# Patient Record
Sex: Male | Born: 1970
Health system: Southern US, Community
[De-identification: ages and names within clinical notes are randomized; demographics above are authoritative.]

## PROBLEM LIST (undated history)

## (undated) DIAGNOSIS — B019 Varicella without complication: Secondary | ICD-10-CM

## (undated) HISTORY — PX: HAND SURGERY: SHX662

## (undated) HISTORY — PX: CLAVICLE SURGERY: SHX598

## (undated) HISTORY — DX: Varicella without complication: B01.9

---

## 2008-05-18 ENCOUNTER — Emergency Department: Payer: Self-pay | Admitting: Emergency Medicine

## 2008-05-21 ENCOUNTER — Ambulatory Visit: Payer: Self-pay | Admitting: Orthopedic Surgery

## 2015-01-17 ENCOUNTER — Emergency Department
Admission: EM | Admit: 2015-01-17 | Discharge: 2015-01-17 | Disposition: A | Discharge status: Home / Self Care | Payer: BLUE CROSS/BLUE SHIELD | Attending: Emergency Medicine | Admitting: Emergency Medicine

## 2015-01-17 ENCOUNTER — Encounter: Payer: Self-pay | Admitting: Emergency Medicine

## 2015-01-17 DIAGNOSIS — K1379 Other lesions of oral mucosa: Secondary | ICD-10-CM | POA: Diagnosis not present

## 2015-01-17 DIAGNOSIS — R220 Localized swelling, mass and lump, head: Secondary | ICD-10-CM | POA: Diagnosis present

## 2015-01-17 DIAGNOSIS — K121 Other forms of stomatitis: Secondary | ICD-10-CM

## 2015-01-17 MED ORDER — DEXAMETHASONE SODIUM PHOSPHATE 10 MG/ML IJ SOLN
INTRAMUSCULAR | Status: AC
  Administered 2015-01-17: 10 mg via INTRAMUSCULAR
  Filled 2015-01-17: qty 1

## 2015-01-17 MED ORDER — DEXAMETHASONE SODIUM PHOSPHATE 10 MG/ML IJ SOLN
10.0000 mg | Freq: Once | INTRAMUSCULAR | Status: AC
  Administered 2015-01-17: 10 mg via INTRAMUSCULAR

## 2015-01-17 MED ORDER — AMOXICILLIN 500 MG PO CAPS
500.0000 mg | ORAL_CAPSULE | Freq: Once | ORAL | Status: AC
  Administered 2015-01-17: 500 mg via ORAL

## 2015-01-17 MED ORDER — AMOXICILLIN 500 MG PO CAPS
ORAL_CAPSULE | ORAL | Status: AC
  Administered 2015-01-17: 500 mg via ORAL
  Filled 2015-01-17: qty 1

## 2015-01-17 MED ORDER — KETOROLAC TROMETHAMINE 10 MG PO TABS
ORAL_TABLET | ORAL | Status: DC
Start: 2015-01-17 — End: 2015-01-18
  Filled 2015-01-17: qty 1

## 2015-01-17 MED ORDER — KETOROLAC TROMETHAMINE 10 MG PO TABS: 10.0000 mg | ORAL_TABLET | Freq: Once | ORAL | Status: DC

## 2015-01-17 MED ORDER — AMOXICILLIN 500 MG PO TABS
500.0000 mg | ORAL_TABLET | Freq: Two times a day (BID) | ORAL | Status: DC
Start: 2015-01-17 — End: 2015-05-09

## 2015-01-17 MED ORDER — KETOROLAC TROMETHAMINE 10 MG PO TABS: 10.0000 mg | ORAL_TABLET | Freq: Four times a day (QID) | ORAL | Status: DC | PRN

## 2015-01-17 NOTE — ED Provider Notes (Signed)
[facility] Emergency Department Provider Note  ____________________________________________  Time seen: 2026  I have reviewed the triage vital signs and the nursing notes.   HISTORY  Chief Complaint Oral Swelling    HPI Warren Harrison is a 44 y.o. male arrives here today with swelling along his right jaw going down to his neck had dental surgery yesterday states that it started swelling up a lot and felt like he was feeling full in his neck and below his jaw and he was concerned denies any fevers chills or drainage states it has gone down somewhat ice denies any pain currently at the worst was a 2 out of 10 improved with ice but nothing seemingly making it otherwise worse or better is here today for further evaluation and possible treatment   History reviewed. No pertinent past medical history.  There are no active problems to display for this patient.   History reviewed. No pertinent past surgical history.  Current Outpatient Rx  Name  Route  Sig  Dispense  Refill  . amoxicillin (AMOXIL) 500 MG tablet   Oral   Take 1 tablet (500 mg total) by mouth 2 (two) times daily.   14 tablet   0   . ketorolac (TORADOL) 10 MG tablet   Oral   Take 1 tablet (10 mg total) by mouth every 6 (six) hours as needed.   20 tablet   0     Allergies Review of patient's allergies indicates no known allergies.  No family history on file.  Social History History  Substance Use Topics  . Smoking status: Never Smoker   . Smokeless tobacco: Not on file  . Alcohol Use: Yes    Review of Systems Constitutional: No fever/chills Eyes: No visual changes. ENT: No sore throat. Cardiovascular: Denies chest pain. Respiratory: Denies shortness of breath. Gastrointestinal: No abdominal pain.  No nausea, no vomiting.  No diarrhea.  No constipation. Genitourinary: Negative for dysuria. Musculoskeletal: Negative for back pain. Skin: Negative for rash. Neurological:  Negative for headaches, focal weakness or numbness.  10-point ROS otherwise negative.  ____________________________________________   PHYSICAL EXAM:  VITAL SIGNS: ED Triage Vitals  Enc Vitals Group     BP 01/17/15 1844 120/81 mmHg     Pulse Rate 01/17/15 1844 63     Resp 01/17/15 1844 18     Temp 01/17/15 1844 98.5 F (36.9 C)     Temp Source 01/17/15 1844 Oral     SpO2 01/17/15 1844 96 %     Weight 01/17/15 1844 200 lb (90.719 kg)     Height 01/17/15 1844 5\' 7"  (1.702 m)     Head Cir --      Peak Flow --      Pain Score 01/17/15 1844 2     Pain Loc --      Pain Edu? --      Excl. in GC? --     Constitutional: Alert and oriented. Well appearing and in no acute distress. Eyes: Conjunctivae are normal. PERRL. EOMI. Head: Atraumatic. Nose: No congestion/rhinnorhea. Mouth/Throat: Mucous membranes are moist.  Oropharynx non-erythematous. Mild swelling along the right mandible mild swelling in the submandibular and right anterior cervical lymph nodes Neck: No stridor.   Cardiovascular: Normal rate, regular rhythm. Grossly normal heart sounds.  Good peripheral circulation. Respiratory: Normal respiratory effort.  No retractions. Lungs CTAB. Gastrointestinal: Soft and nontender. No distention. No abdominal bruits. No CVA tenderness. Musculoskeletal: No lower extremity tenderness nor edema.  No joint  effusions. Neurologic:  Normal speech and language. No gross focal neurologic deficits are appreciated. Speech is normal. No gait instability. Skin:  Skin is warm, dry and intact. No rash noted. Psychiatric: Mood and affect are normal. Speech and behavior are normal.  ____________________________________________    PROCEDURES  Procedure(s) performed: None  Critical Care performed: No  ____________________________________________   INITIAL IMPRESSION / ASSESSMENT AND PLAN / ED COURSE  Pertinent labs & imaging results that were available during my care of the patient were  reviewed by me and considered in my medical decision making (see chart for details).  Initial impression on this patient has postsurgical inflammation patient had dental work less than 24 hours ago denies having swelling around that area moving towards his neck the swelling has gone down with ice while in the department but still feels inflamed to him given dexamethasone Toradol and start him on amoxicillin and recommended he follow with his dentist as soon as possible returning for any acute concerns or worsening symptoms as airway is open and intact normal voice no difficulty with food or fluids ____________________________________________   FINAL CLINICAL IMPRESSION(S) / ED DIAGNOSES  Final diagnoses:  Oral inflammation     Hertha Gergen Rosalyn Gess, PA-C 01/17/15 2111  Darien Ramus, MD 01/17/15 [phone]

## 2015-01-17 NOTE — ED Notes (Signed)
Pt with recent dental work and now having oral swelling and pain.

## 2015-01-17 NOTE — ED Notes (Signed)
Pt reports oral swelling x 24 hours w/ pain in lower right jaw area.  Pt NAD at this time

## 2015-05-08 ENCOUNTER — Ambulatory Visit: Payer: BLUE CROSS/BLUE SHIELD | Admitting: Family Medicine

## 2015-05-09 ENCOUNTER — Encounter: Payer: Self-pay | Admitting: Family Medicine

## 2015-05-09 ENCOUNTER — Ambulatory Visit (INDEPENDENT_AMBULATORY_CARE_PROVIDER_SITE_OTHER): Payer: BLUE CROSS/BLUE SHIELD | Admitting: Family Medicine

## 2015-05-09 ENCOUNTER — Other Ambulatory Visit: Payer: Self-pay | Admitting: Family Medicine

## 2015-05-09 VITALS — BP 108/72 | HR 68 | Temp 98.4°F | Ht 68.31 in | Wt 198.4 lb

## 2015-05-09 DIAGNOSIS — M545 Low back pain, unspecified: Secondary | ICD-10-CM | POA: Insufficient documentation

## 2015-05-09 DIAGNOSIS — E663 Overweight: Secondary | ICD-10-CM

## 2015-05-09 DIAGNOSIS — M79604 Pain in right leg: Secondary | ICD-10-CM

## 2015-05-09 DIAGNOSIS — M5441 Lumbago with sciatica, right side: Secondary | ICD-10-CM

## 2015-05-09 NOTE — Progress Notes (Signed)
Patient ID: Warren Harrison, male   DOB: [birth year], 44 y.o.   MRN: [id number]  Marikay Alar, MD Phone: [phone]  Warren Harrison is a 44 y.o. male who presents today for new patient visit.  Back pain: low back. Has been intermittently occuring for the past 3-4 years. Thought it was hamstring issue, though discovered it was in his low back. Mostly on the right side. Has a recurrence yearly. Often happens when he bends over the wrong way or over does it. Occasionally notes his toes (lateral toes) will feel like they are asleep with this. R>L. Has occasional shooting pain down the back of his right leg. No saddle anesthesia, fevers, cancer, incontinence, or weakness.Has tried home exercises for this with no benefit. Ibuprofen is helpful. Chiropractor has been beneficial. Last hurt back several weeks ago and is much improved at this time.   Active Ambulatory Problems    Diagnosis Date Noted  . Low back pain radiating to right leg 05/09/2015   Resolved Ambulatory Problems    Diagnosis Date Noted  . No Resolved Ambulatory Problems   Past Medical History  Diagnosis Date  . Chicken pox     Family History  Problem Relation Age of Onset  . Alcoholism      parent  . Arthritis      parent and grandparent  . Cancer      ovarian/uterine - sister  . Breast cancer      mother  . Heart disease      parent  . Stroke      grandparent  . Other      intestinal issues - parent with rupture of intestines, paternal grandparents with impaction followed by intestinal rupture, cousin with diverticulitis and intestinal rupture - all died from these issues    Social History   Social History  . Marital Status: Married    Spouse Name: N/A  . Number of Children: N/A  . Years of Education: N/A   Occupational History  . Not on file.   Social History Main Topics  . Smoking status: Never Smoker   . Smokeless tobacco: Not on file  . Alcohol Use: 4.2 - 8.4 oz/week    7-14 Standard drinks or  equivalent per week  . Drug Use: No  . Sexual Activity: Not on file   Other Topics Concern  . Not on file   Social History Narrative    ROS   General:  Negative for unexplained weight loss, fever Skin: Negative for new or changing mole, sore that won't heal HEENT: Negative for trouble hearing, trouble seeing, ringing in ears, mouth sores, hoarseness, change in voice, dysphagia. CV:  Negative for chest pain, dyspnea, edema, palpitations Resp: Negative for cough, dyspnea, hemoptysis GI: Negative for nausea, vomiting, diarrhea, constipation, abdominal pain, melena, hematochezia. GU: Negative for dysuria, incontinence, urinary hesitance, hematuria, vaginal or penile discharge, polyuria, sexual difficulty, lumps in testicle or breasts MSK: positive for back pain, Negative for muscle cramps or aches, joint pain or swelling Neuro: positive for numbness in lateral toes, Negative for headaches, weakness, dizziness, passing out/fainting Psych: Negative for depression, anxiety, memory problems  Objective  Physical Exam Filed Vitals:   05/09/15 0846  BP: 108/72  Pulse: 68  Temp: 98.4 F (36.9 C)    Physical Exam  Constitutional: He is well-developed, well-nourished, and in no distress.  HENT:  Head: Normocephalic and atraumatic.  Right Ear: External ear normal.  Left Ear: External ear normal.  Mouth/Throat: Oropharynx is clear and moist.  No oropharyngeal exudate.  Eyes: Conjunctivae are normal. Pupils are equal, round, and reactive to light.  Neck: Neck supple.  Cardiovascular: Normal rate, regular rhythm and normal heart sounds.  Exam reveals no gallop and no friction rub.   No murmur heard. 2+ DP pulses  Pulmonary/Chest: Effort normal and breath sounds normal. No respiratory distress. He has no wheezes. He has no rales.  Abdominal: Soft. Bowel sounds are normal. He exhibits no distension. There is no tenderness. There is no rebound and no guarding.  Musculoskeletal:  No midline  spine tenderness, no back tenderness, no swelling, negative straight leg raise  Lymphadenopathy:    He has no cervical adenopathy.  Neurological: He is alert.  5/5 strength in bilateral quads, hamstrings, plantar and dorsiflexion, sensation to light touch minimally diminished in right little toe, otherwise intact in bilateral LE, normal gait, 2+ patellar reflexes  Skin: Skin is warm and dry. He is not diaphoretic.  Psychiatric: Mood and affect normal.     Assessment/Plan:   Low back pain radiating to right leg Patient with several year history of low back pain with associated sciatica and intermittent lateral toe numbness R>L. No tenderness on exam. Minimal sensory deficit in right little toe. Negative straight leg raise. Given history it sounds as if the patient has a disc issue. Could be repeated strains, though with repeated LE symptoms is likely a disc issue. No red flags. Normal strength. Discussed options for work up and treatment. Discussed XR vs MRI imaging given LE symptoms and patient declined these. Discussed PT vs home exercises and patient opted for PT referral. Discussed gabapentin vs NSAIDs for this and patient declined medications at this time. Will refer to PT. Given return precautions.     Orders Placed This Encounter  Procedures  . PT eval and treat    Standing Status: Future     Number of Occurrences:      Standing Expiration Date: 05/08/2016    Marikay Alar

## 2015-05-09 NOTE — Patient Instructions (Signed)
Nice to meet you. We will refer you to PT for evaluation and treatment of your back pain.  You can continue to take ibuprofen as needed for this.  If you develop numbness, weakness, fever, incontinence, persistent pain, numbness between your legs, or change in symptoms please seek medical attention.   Back Pain, Adult Back pain is very common. The pain often gets better over time. The cause of back pain is usually not dangerous. Most people can learn to manage their back pain on their own.  HOME CARE  Watch your back pain for any changes. The following actions may help to lessen any pain you are feeling:  Stay active. Start with short walks on flat ground if you can. Try to walk farther each day.  Exercise regularly as told by your doctor. Exercise helps your back heal faster. It also helps avoid future injury by keeping your muscles strong and flexible.  Do not sit, drive, or stand in one place for more than 30 minutes.  Do not stay in bed. Resting more than 1-2 days can slow down your recovery.  Be careful when you bend or lift an object. Use good form when lifting:  Bend at your knees.  Keep the object close to your body.  Do not twist.  Sleep on a firm mattress. Lie on your side, and bend your knees. If you lie on your back, put a pillow under your knees.  Take medicines only as told by your doctor.  Put ice on the injured area.  Put ice in a plastic bag.  Place a towel between your skin and the bag.  Leave the ice on for 20 minutes, 2-3 times a day for the first 2-3 days. After that, you can switch between ice and heat packs.  Avoid feeling anxious or stressed. Find good ways to deal with stress, such as exercise.  Maintain a healthy weight. Extra weight puts stress on your back. GET HELP IF:   You have pain that does not go away with rest or medicine.  You have worsening pain that goes down into your legs or buttocks.  You have pain that does not get better in one  week.  You have pain at night.  You lose weight.  You have a fever or chills. GET HELP RIGHT AWAY IF:   You cannot control when you poop (bowel movement) or pee (urinate).  Your arms or legs feel weak.  Your arms or legs lose feeling (numbness).  You feel sick to your stomach (nauseous) or throw up (vomit).  You have belly (abdominal) pain.  You feel like you may pass out (faint).   This information is not intended to replace advice given to you by your health care provider. Make sure you discuss any questions you have with your health care provider.   Document Released: 01/05/2008 Document Revised: 08/09/2014 Document Reviewed: 11/20/2013 Elsevier Interactive Patient Education [employer]

## 2015-05-09 NOTE — Assessment & Plan Note (Signed)
Patient with several year history of low back pain with associated sciatica and intermittent lateral toe numbness R>L. No tenderness on exam. Minimal sensory deficit in right little toe. Negative straight leg raise. Given history it sounds as if the patient has a disc issue. Could be repeated strains, though with repeated LE symptoms is likely a disc issue. No red flags. Normal strength. Discussed options for work up and treatment. Discussed XR vs MRI imaging given LE symptoms and patient declined these. Discussed PT vs home exercises and patient opted for PT referral. Discussed gabapentin vs NSAIDs for this and patient declined medications at this time. Will refer to PT. Given return precautions.

## 2015-05-09 NOTE — Progress Notes (Signed)
Pre visit review using our clinic review tool, if applicable. No additional management support is needed unless otherwise documented below in the visit note. 

## 2018-01-18 ENCOUNTER — Ambulatory Visit: Payer: Self-pay | Admitting: Family Medicine

## 2018-01-18 DIAGNOSIS — E079 Disorder of thyroid, unspecified: Secondary | ICD-10-CM | POA: Diagnosis not present

## 2018-01-18 DIAGNOSIS — M7989 Other specified soft tissue disorders: Secondary | ICD-10-CM | POA: Diagnosis not present

## 2018-01-18 DIAGNOSIS — M67839 Other specified disorders of synovium and tendon, unspecified forearm: Secondary | ICD-10-CM | POA: Diagnosis not present

## 2018-01-18 NOTE — Telephone Encounter (Signed)
  Called in c/o having pains starting in both of his elbows and radiating to both of his hands and fingertips.   Having some tingling in fingertips too.   It's a constant aching pain.   See triage notes.  I was not able to get him in [facility] so I have referred him to the urgent care center since he needs to be seen within 24 hours per the protocol.    He was agreeable to this.   Reason for Disposition . Numbness (i.e., loss of sensation) in hand or fingers  Answer Assessment - Initial Assessment Questions 1. ONSET: "When did the pain start?"     I'm having shooting pains in both arms from the elbows to my fingertips.   Everything I touch feel warm like touching a hot cup of coffee.   It started about 48 hours ago. 2. LOCATION: "Where is the pain located?"     Aching pain from elbows to fingertips in both arms.    It's all the time.  Ibuprofen helps.   It started after I was doing yard work both Saturday and Sunday it started Sunday evening. 3. PAIN: "How bad is the pain?" (Scale 1-10; or mild, moderate, severe)   - MILD (1-3): doesn't interfere with normal activities   - MODERATE (4-7): interferes with normal activities (e.g., work or school) or awakens from sleep   - SEVERE (8-10): excruciating pain, unable to do any normal activities, unable to hold a cup of water     2 maybe on the pain scale. 4. WORK OR EXERCISE: "Has there been any recent work or exercise that involved this part of the body?"     Yard work Friday, Saturday, and Sunday.     I'm able to use my arms and hands normally. 5. CAUSE: "What do you think is causing the arm pain?"     I think it's a repetitive injury.    I'm not sure.   6. OTHER SYMPTOMS: "Do you have any other symptoms?" (e.g., neck pain, swelling, rash, fever, numbness, weakness)     No neck pain or stiffness.    Both of my wrists look swollen.   Very minor, I can still see the tendons and all ok. 7. PREGNANCY: "Is there  any chance you are pregnant?" "When was your last menstrual period?"     N/A  Protocols used: ARM PAIN-A-AH

## 2018-03-14 ENCOUNTER — Ambulatory Visit (INDEPENDENT_AMBULATORY_CARE_PROVIDER_SITE_OTHER): Payer: BLUE CROSS/BLUE SHIELD | Admitting: Family Medicine

## 2018-03-14 ENCOUNTER — Encounter: Payer: Self-pay | Admitting: Family Medicine

## 2018-03-14 VITALS — BP 104/60 | HR 67 | Temp 98.2°F | Resp 16 | Wt 190.4 lb

## 2018-03-14 DIAGNOSIS — H1033 Unspecified acute conjunctivitis, bilateral: Secondary | ICD-10-CM

## 2018-03-14 MED ORDER — OFLOXACIN 0.3 % OP SOLN: 1.0000 [drp] | Freq: Four times a day (QID) | OPHTHALMIC | 0 refills | Status: AC

## 2018-03-14 NOTE — Patient Instructions (Signed)
Great to meet you!  Eye drops. Avoid rubbing eye. Do good handwashing and sanitize commonly used surfaces. Change pillow cases to avoid spread/re-infection.

## 2018-03-14 NOTE — Progress Notes (Signed)
   Subjective:    Patient ID: Warren Harrison, male    DOB: August 07, 1970, 47 y.o.   MRN: [id number]  HPI   Presents to clinic due to suspected pink eye.  Patient states both his wife and young son is currently on treatment for pinkeye.  He woke up this morning with bilateral eye redness and bilateral eye itching.   Patient Active Problem List   Diagnosis Date Noted  . Low back pain radiating to right leg 05/09/2015     Social History   Tobacco Use  . Smoking status: Never Smoker  Substance Use Topics  . Alcohol use: Yes    Alcohol/week: 7.0 - 14.0 standard drinks    Types: 7 - 14 Standard drinks or equivalent per week   Review of Systems  Constitutional: Negative for chills, fatigue and fever.  HENT: Negative for congestion, ear pain, sinus pain and sore throat.   Eyes: Redness, ithching bilateral eyes Respiratory: Negative for cough, shortness of breath and wheezing.   Cardiovascular: Negative for chest pain, palpitations and leg swelling.  Gastrointestinal: Negative for abdominal pain, diarrhea, nausea and vomiting.  Genitourinary: Negative for dysuria, frequency and urgency.  Musculoskeletal: Negative for arthralgias and myalgias.  Skin: Negative for color change, pallor and rash.  Neurological: Negative for syncope, light-headedness and headaches.  Psychiatric/Behavioral: The patient is not nervous/anxious.    Objective:   Physical Exam  Constitutional: He appears well-developed and well-nourished. No distress.  HENT:  Head: Normocephalic and atraumatic.  Eyes: Pupils are equal, round, and reactive to light. Conjunctivae red and inflamed. No discharge at this time. EOM are normal. No scleral icterus.  Cardiovascular: Normal rate, regular rhythm.   Pulmonary/Chest: Effort normal. No respiratory distress.   Neurological: He is alert and oriented to person, place, and time.  Gait normal  Skin: Skin is warm and dry. He is not diaphoretic. No pallor.  Psychiatric: He has  a normal mood and affect. His behavior is normal.  Nursing note and vitals reviewed.      Assessment & Plan:    Conjunctivitis -- We will treat due to close contacts being infected. Eye drops. Avoid rubbing eye. Do good handwashing and sanitize commonly used surfaces. Change pillow cases to avoid spread/re-infection.  Follow up if symptoms persist or worsen.

## 2018-03-27 ENCOUNTER — Ambulatory Visit (INDEPENDENT_AMBULATORY_CARE_PROVIDER_SITE_OTHER): Payer: BLUE CROSS/BLUE SHIELD | Admitting: Family Medicine

## 2018-03-27 ENCOUNTER — Encounter: Payer: Self-pay | Admitting: Family Medicine

## 2018-03-27 VITALS — BP 130/70 | HR 67 | Temp 98.5°F | Ht 68.31 in | Wt 192.0 lb

## 2018-03-27 DIAGNOSIS — E663 Overweight: Secondary | ICD-10-CM

## 2018-03-27 DIAGNOSIS — E039 Hypothyroidism, unspecified: Secondary | ICD-10-CM | POA: Diagnosis not present

## 2018-03-27 MED ORDER — LEVOTHYROXINE SODIUM 25 MCG PO CAPS: 1.0000 | ORAL_CAPSULE | Freq: Every day | ORAL | 2 refills | Status: DC

## 2018-03-27 NOTE — Patient Instructions (Signed)
Hypothyroidism Hypothyroidism is a disorder of the thyroid. The thyroid is a large gland that is located in the lower front of the neck. The thyroid releases hormones that control how the body works. With hypothyroidism, the thyroid does not make enough of these hormones. What are the causes? Causes of hypothyroidism may include:  Viral infections.  Pregnancy.  Your own defense system (immune system) attacking your thyroid.  Certain medicines.  Birth defects.  Past radiation treatments to your head or neck.  Past treatment with radioactive iodine.  Past surgical removal of part or all of your thyroid.  Problems with the gland that is located in the center of your brain (pituitary).  What are the signs or symptoms? Signs and symptoms of hypothyroidism may include:  Feeling as though you have no energy (lethargy).  Inability to tolerate cold.  Weight gain that is not explained by a change in diet or exercise habits.  Dry skin.  Coarse hair.  Menstrual irregularity.  Slowing of thought processes.  Constipation.  Sadness or depression.  How is this diagnosed? Your health care provider may diagnose hypothyroidism with blood tests and ultrasound tests. How is this treated? Hypothyroidism is treated with medicine that replaces the hormones that your body does not make. After you begin treatment, it may take several weeks for symptoms to go away. Follow these instructions at home:  Take medicines only as directed by your health care provider.  If you start taking any new medicines, tell your health care provider.  Keep all follow-up visits as directed by your health care provider. This is important. As your condition improves, your dosage needs may change. You will need to have blood tests regularly so that your health care provider can watch your condition. Contact a health care provider if:  Your symptoms do not get better with treatment.  You are taking thyroid  replacement medicine and: ? You sweat excessively. ? You have tremors. ? You feel anxious. ? You lose weight rapidly. ? You cannot tolerate heat. ? You have emotional swings. ? You have diarrhea. ? You feel weak. Get help right away if:  You develop chest pain.  You develop an irregular heartbeat.  You develop a rapid heartbeat. This information is not intended to replace advice given to you by your health care provider. Make sure you discuss any questions you have with your health care provider. Document Released: 07/19/2005 Document Revised: 12/25/2015 Document Reviewed: 12/04/2013 Elsevier Interactive Patient Education  2018 [employer]  

## 2018-03-27 NOTE — Progress Notes (Signed)
   Subjective:    Patient ID: Warren Harrison, male    DOB: 22-Jul-1971, 47 y.o.   MRN: [id number]  HPI   Patient presents to clinic for follow-up on lab results received from [facility].  He went to urgent care due to bilateral wrist pain, some fatigue and the provider suspected possible hypothyroidism so labs were drawn. The thyroid panel was drawn showing a TSH of 5.190 indicating hypothyroidism.  His CBC and metabolic panel also reviewed from the urgent care and these were normal.  Patient also states he has gained at least 20 pounds in the past 4-5 months.  Filed Weights   03/27/18 1624  Weight: 192 lb (87.1 kg)     Patient Active Problem List   Diagnosis Date Noted  . Low back pain radiating to right leg 05/09/2015   Social History   Tobacco Use  . Smoking status: Never Smoker  Substance Use Topics  . Alcohol use: Yes    Alcohol/week: 7.0 - 14.0 standard drinks    Types: 7 - 14 Standard drinks or equivalent per week    Review of Systems  Constitutional: Negative for chills, fever. Some fatigue. Weight gain over the last 4-5 months of approx 20 pounds. HENT: Negative for congestion, ear pain, sinus pain and sore throat.   Eyes: Negative.   Respiratory: Negative for cough, shortness of breath and wheezing.   Cardiovascular: Negative for chest pain, palpitations and leg swelling.  Gastrointestinal: Negative for abdominal pain, diarrhea, nausea and vomiting.  Genitourinary: Negative for dysuria, frequency and urgency.  Musculoskeletal: Negative for arthralgias and myalgias.  Skin: Negative for color change, pallor and rash.  Neurological: Negative for syncope, light-headedness and headaches.  Psychiatric/Behavioral: The patient is not nervous/anxious.       Objective:   Physical Exam  Constitutional: He is oriented to person, place, and time. He appears well-developed and well-nourished. No distress.  HENT:  Head: Normocephalic and atraumatic.  Eyes:  Conjunctivae and EOM are normal. No scleral icterus.  Neck: Normal range of motion. Neck supple. No thyromegaly present.  Cardiovascular: Normal rate and regular rhythm.  Pulmonary/Chest: Effort normal and breath sounds normal. No respiratory distress.  Musculoskeletal:  Gait normal.  Neurological: He is alert and oriented to person, place, and time. No cranial nerve deficit.  Skin: Skin is warm and dry. No pallor.  Psychiatric: He has a normal mood and affect. His behavior is normal.  Nursing note and vitals reviewed.   Vitals:   03/27/18 1624  BP: 130/70  Pulse: 67  Temp: 98.5 F (36.9 C)  SpO2: 97%   Body mass index is 28.93 kg/m.     Assessment & Plan:    Hypothyroidism -- patient will begin low-dose levothyroxine 25 mcg daily.  He is aware to take this medication on empty stomach.  We will follow-up in approximately 6 weeks, lab work ordered for future visit to reassess thyroid function at that time.  Weight gain - patient was advised that hypothyroidism could play a part in the weight gain, but it is also important to be sure to follow good diet and regular exercise to keep weight under good control.   Follow up in 6 weeks for recheck.

## 2018-05-09 ENCOUNTER — Ambulatory Visit (INDEPENDENT_AMBULATORY_CARE_PROVIDER_SITE_OTHER): Payer: BLUE CROSS/BLUE SHIELD | Admitting: Family Medicine

## 2018-05-09 ENCOUNTER — Encounter: Payer: Self-pay | Admitting: Family Medicine

## 2018-05-09 VITALS — BP 102/60 | HR 66 | Temp 97.8°F | Ht 67.5 in | Wt 186.6 lb

## 2018-05-09 DIAGNOSIS — G4700 Insomnia, unspecified: Secondary | ICD-10-CM | POA: Diagnosis not present

## 2018-05-09 DIAGNOSIS — Z23 Encounter for immunization: Secondary | ICD-10-CM

## 2018-05-09 DIAGNOSIS — E039 Hypothyroidism, unspecified: Secondary | ICD-10-CM

## 2018-05-09 DIAGNOSIS — E038 Other specified hypothyroidism: Secondary | ICD-10-CM | POA: Insufficient documentation

## 2018-05-09 LAB — THYROID PANEL WITH TSH
Free Thyroxine Index: 1.8 (ref 1.4–3.8)
T3 Uptake: 39 % — ABNORMAL HIGH (ref 22–35)
T4, Total: 4.7 ug/dL — ABNORMAL LOW (ref 4.9–10.5)
TSH: 3.91 mIU/L (ref 0.40–4.50)

## 2018-05-09 NOTE — Progress Notes (Signed)
   Subjective:    Patient ID: Warren Harrison, male    DOB: [birth year], 47 y.o.   MRN: [id number]  HPI   Presents to clinic for 6 week follow up on thyroid after starting on levothyroxine 25 mcg.  He had a TSH level drawn at an urgent care which showed him to be hypothyroid, TSH was 5.190. We will recheck TSH levels today.  Patient has been tolerating medication well.  He has not missed any doses except 1 or 2.  States since taking medication he notices he has been sleeping slightly better, states he goes through periods where he has nights he cannot get to sleep, but has noticed with the levothyroxine they have not been as severe.   Patient Active Problem List   Diagnosis Date Noted  . Hypothyroidism 05/09/2018  . Low back pain radiating to right leg 05/09/2015   Social History   Tobacco Use  . Smoking status: Never Smoker  Substance Use Topics  . Alcohol use: Yes    Alcohol/week: 7.0 - 14.0 standard drinks    Types: 7 - 14 Standard drinks or equivalent per week   Review of Systems   Constitutional: Negative for chills, fatigue and fever.  HENT: Negative for congestion, ear pain, sinus pain and sore throat.   Eyes: Negative.   Respiratory: Negative for cough, shortness of breath and wheezing.   Cardiovascular: Negative for chest pain, palpitations and leg swelling.  Gastrointestinal: Negative for abdominal pain, diarrhea, nausea and vomiting.  Genitourinary: Negative for dysuria, frequency and urgency.  Musculoskeletal: Negative for arthralgias and myalgias.  Skin: Negative for color change, pallor and rash.  Neurological: Negative for syncope, light-headedness and headaches.  Psychiatric/Behavioral: The patient is not nervous/anxious.       Objective:   Physical Exam  Constitutional: He appears well-developed and well-nourished. No distress.  Head: Normocephalic and atraumatic.  Eyes: Conjunctivae and EOM are normal. No scleral icterus.  Neck: Normal range of motion.  Neck supple. No tracheal deviation present. No Thyromegaly. Cardiovascular: Normal rate, regular rhythm and normal heart sounds.  Pulmonary/Chest: Effort normal and breath sounds normal. No respiratory distress. He has no wheezes. He has no rales.  Neurological: He is alert and oriented to person, place, and time.  Gait normal  Skin: Skin is warm and dry. He is not diaphoretic. No pallor.  Psychiatric: He has a normal mood and affect. His behavior is normal. Thought content normal.   Nursing note and vitals reviewed.  Vitals:   05/09/18 1645  BP: 102/60  Pulse: 66  Temp: 97.8 F (36.6 C)  SpO2: 98%      Assessment & Plan:   Hypothyroidism - patient will continue on levothyroxine 25 mcg dose at this time.  New thyroid panel drawn in clinic today.  Depending on results of lab levels we will make medication adjustments accordingly.  Insomnia (off/on) -- Seems improved with levothyroxine.   Flu vaccine given  Follow-up in 6 months for recheck.  Return to clinic sooner if issues arise.

## 2018-05-10 ENCOUNTER — Encounter: Payer: Self-pay | Admitting: Family Medicine

## 2018-05-10 DIAGNOSIS — G4700 Insomnia, unspecified: Secondary | ICD-10-CM | POA: Insufficient documentation

## 2018-05-10 MED ORDER — LEVOTHYROXINE SODIUM 50 MCG PO TABS
50.0000 ug | ORAL_TABLET | Freq: Every day | ORAL | 1 refills | Status: DC
Start: 2018-05-10 — End: 2018-05-12

## 2018-05-10 NOTE — Addendum Note (Signed)
Addended by: Leanora Cover on: 05/10/2018 08:02 PM   Modules accepted: Orders

## 2018-05-11 ENCOUNTER — Telehealth: Payer: Self-pay | Admitting: Lab

## 2018-05-11 DIAGNOSIS — E039 Hypothyroidism, unspecified: Secondary | ICD-10-CM

## 2018-05-11 NOTE — Telephone Encounter (Signed)
Called Pt to see what is the correct Pharmacy and address that he wants his prescription of Levothyroxine sent to. But no one answered so I left a message. Pec can speak to the Pt.

## 2018-05-12 ENCOUNTER — Other Ambulatory Visit: Payer: Self-pay | Admitting: Lab

## 2018-05-12 DIAGNOSIS — E039 Hypothyroidism, unspecified: Secondary | ICD-10-CM

## 2018-05-12 MED ORDER — LEVOTHYROXINE SODIUM 50 MCG PO TABS: 50.0000 ug | ORAL_TABLET | Freq: Every day | ORAL | 1 refills | Status: DC

## 2018-07-14 ENCOUNTER — Ambulatory Visit (INDEPENDENT_AMBULATORY_CARE_PROVIDER_SITE_OTHER): Payer: BLUE CROSS/BLUE SHIELD | Admitting: Family Medicine

## 2018-07-14 ENCOUNTER — Encounter: Payer: Self-pay | Admitting: Family Medicine

## 2018-07-14 VITALS — BP 104/68 | HR 80 | Temp 98.7°F | Ht 67.0 in | Wt 195.0 lb

## 2018-07-14 DIAGNOSIS — J988 Other specified respiratory disorders: Secondary | ICD-10-CM | POA: Diagnosis not present

## 2018-07-14 DIAGNOSIS — R0989 Other specified symptoms and signs involving the circulatory and respiratory systems: Secondary | ICD-10-CM

## 2018-07-14 DIAGNOSIS — R059 Cough, unspecified: Secondary | ICD-10-CM

## 2018-07-14 DIAGNOSIS — R05 Cough: Secondary | ICD-10-CM | POA: Diagnosis not present

## 2018-07-14 MED ORDER — AZITHROMYCIN 250 MG PO TABS: ORAL_TABLET | ORAL | 0 refills | Status: DC

## 2018-07-14 MED ORDER — METHYLPREDNISOLONE 4 MG PO TBPK: ORAL_TABLET | ORAL | 0 refills | Status: DC

## 2018-07-14 NOTE — Progress Notes (Signed)
Subjective:    Patient ID: Warren Harrison, male    DOB: [birth year], 47 y.o.   MRN: [id number]  HPI   Patient complains of cough, chest congestion, feeling rundown for about 3 weeks.  Patient states his young children have both been sick for the past month or so as well.  States the cough is worse at night, has been using NyQuil to help her get some sleep.  Does cough up some thicker white/tan phlegm, productive cough only began the beginning of this week.  Denies fever or chills.  Denies shortness of breath or wheezing.   Patient Active Problem List   Diagnosis Date Noted  . Insomnia 05/10/2018  . Hypothyroidism 05/09/2018  . Low back pain radiating to right leg 05/09/2015   Social History   Tobacco Use  . Smoking status: Never Smoker  . Smokeless tobacco: Never Used  Substance Use Topics  . Alcohol use: Yes    Alcohol/week: 7.0 - 14.0 standard drinks    Types: 7 - 14 Standard drinks or equivalent per week   Review of Systems  Constitutional: +fatigue. Negative for chills, fever.  HENT: Negative for congestion, ear pain, sinus pain and sore throat.   Eyes: Negative.   Respiratory: +cough & chest congestion. Negative for shortness of breath and wheezing.   Cardiovascular: Negative for chest pain, palpitations and leg swelling.  Gastrointestinal: Negative for abdominal pain, diarrhea, nausea and vomiting.  Genitourinary: Negative for dysuria, frequency and urgency.  Musculoskeletal: Negative for arthralgias and myalgias.  Skin: Negative for color change, pallor and rash.  Neurological: Negative for syncope, light-headedness and headaches.  Psychiatric/Behavioral: The patient is not nervous/anxious.       Objective:   Physical Exam Vitals signs and nursing note reviewed.  Constitutional:      General: He is not in acute distress. HENT:     Head: Normocephalic and atraumatic.     Mouth/Throat:     Mouth: Mucous membranes are moist.  Eyes:     General: No scleral  icterus.    Extraocular Movements: Extraocular movements intact.     Conjunctiva/sclera: Conjunctivae normal.  Neck:     Musculoskeletal: Normal range of motion and neck supple. No neck rigidity.  Cardiovascular:     Rate and Rhythm: Normal rate and regular rhythm.  Pulmonary:     Effort: Pulmonary effort is normal. No respiratory distress.     Breath sounds: Wheezing (faint expiratory wheeze) present. No rhonchi or rales.     Comments: +wet sounding cough Skin:    General: Skin is warm and dry.     Coloration: Skin is not pale.  Neurological:     Mental Status: He is alert and oriented to person, place, and time.     Gait: Gait normal.  Psychiatric:        Mood and Affect: Mood normal.        Behavior: Behavior normal.    Vitals:   07/14/18 0916  BP: 104/68  Pulse: 80  Temp: 98.7 F (37.1 C)  SpO2: 95%      Assessment & Plan:   Respiratory infection, cough, chest congestion - due to length of time of symptoms and discolored phlegm with cough we will treat with Z-Pak.  Patient also take prednisone burst to help open up chest congestion.  Patient states that NyQuil has been fairly effective to calm cough at night and allow him to get some sleep, so he declines offer for other people cough medicine.  Patient  advised he can use an over-the-counter Mucinex during the daytime hours to help calm cough as well.  Advised to rest, increase fluids and do good handwashing.  Patient will keep regularly scheduled follow-up with PCP as planned advised return to clinic sooner if any issues arise.

## 2018-08-18 ENCOUNTER — Ambulatory Visit (INDEPENDENT_AMBULATORY_CARE_PROVIDER_SITE_OTHER): Payer: BLUE CROSS/BLUE SHIELD | Admitting: Family Medicine

## 2018-08-18 ENCOUNTER — Encounter: Payer: Self-pay | Admitting: Family Medicine

## 2018-08-18 ENCOUNTER — Ambulatory Visit (INDEPENDENT_AMBULATORY_CARE_PROVIDER_SITE_OTHER): Payer: BLUE CROSS/BLUE SHIELD

## 2018-08-18 VITALS — BP 98/60 | HR 78 | Temp 98.0°F | Resp 16 | Ht 67.0 in | Wt 194.4 lb

## 2018-08-18 DIAGNOSIS — Z20818 Contact with and (suspected) exposure to other bacterial communicable diseases: Secondary | ICD-10-CM

## 2018-08-18 DIAGNOSIS — R05 Cough: Secondary | ICD-10-CM

## 2018-08-18 DIAGNOSIS — R059 Cough, unspecified: Secondary | ICD-10-CM

## 2018-08-18 DIAGNOSIS — R058 Other specified cough: Secondary | ICD-10-CM

## 2018-08-18 DIAGNOSIS — R0989 Other specified symptoms and signs involving the circulatory and respiratory systems: Secondary | ICD-10-CM

## 2018-08-18 MED ORDER — ALBUTEROL SULFATE HFA 108 (90 BASE) MCG/ACT IN AERS: INHALATION_SPRAY | RESPIRATORY_TRACT | 0 refills | Status: DC

## 2018-08-18 MED ORDER — AMOXICILLIN-POT CLAVULANATE 875-125 MG PO TABS
1.0000 | ORAL_TABLET | Freq: Two times a day (BID) | ORAL | 0 refills | Status: DC
Start: 2018-08-18 — End: 2018-11-13

## 2018-08-18 NOTE — Progress Notes (Signed)
Subjective:    Patient ID: Warren Harrison, male    DOB: Jun 05, 1971, 48 y.o.   MRN: [id number]  HPI   Patient presents to clinic complaining of persistent cough that is productive of yellow or brown phlegm, feeling of phlegm in throat and also exposure to strep throat.  Patient has been sick off and on for the past 4 to 6 weeks with some sort of upper respiratory/congestion/cough type illness.  Patient does have a young child who is 46 years old, states child has been sick often in the recent weeks due to attending daycare.  Patient states it is almost impossible to not be exposed to illness from his child, because child always wants to sit on his lap.  Patient has been using over-the-counter cough syrup, and NyQuil at night which has been helpful in helping him to get sleep.  Denies fever or chills.  Denies nausea vomiting or diarrhea.  Denies feeling short of breath.  Patient Active Problem List   Diagnosis Date Noted  . Insomnia 05/10/2018  . Hypothyroidism 05/09/2018  . Low back pain radiating to right leg 05/09/2015   Social History   Tobacco Use  . Smoking status: Never Smoker  . Smokeless tobacco: Never Used  Substance Use Topics  . Alcohol use: Yes    Alcohol/week: 7.0 - 14.0 standard drinks    Types: 7 - 14 Standard drinks or equivalent per week    Review of Systems  Constitutional: Negative for chills, fatigue and fever.  HENT: Negative for congestion, ear pain, sinus pain +feeling of phlegm in throat. Eyes: Negative.   Respiratory: +cough with phlegm, chest congestion.   Cardiovascular: Negative for chest pain, palpitations and leg swelling.  Gastrointestinal: Negative for abdominal pain, diarrhea, nausea and vomiting.  Genitourinary: Negative for dysuria, frequency and urgency.  Musculoskeletal: Negative for arthralgias and myalgias.  Skin: Negative for color change, pallor and rash.  Neurological: Negative for syncope, light-headedness and headaches.    Psychiatric/Behavioral: The patient is not nervous/anxious.    Objective:   Physical Exam  Constitutional: He appears well-developed and well-nourished. No distress.  HENT:  Head: Normocephalic and atraumatic.  Eyes: Conjunctivae and EOM are normal. No scleral icterus.  Ears: Normal Nose/throat: Positive postnasal drip Neck: Normal range of motion. Neck supple. No tracheal deviation present.  Cardiovascular: Normal rate, regular rhythm and normal heart sounds.  Pulmonary/Chest: Effort normal and breath sounds normal. No respiratory distress. He has no wheezes. He has no rales.  Wet sounding cough.  Upper respiratory congestion. Neurological: He is alert and oriented to person, place, and time.  Gait normal  Skin: Skin is warm and dry. He is not diaphoretic. No pallor.  Psychiatric: He has a normal mood and affect. His behavior is normal. Thought content normal.  Nursing note and vitals reviewed.    Vitals:   08/18/18 0922  BP: 98/60  Pulse: 78  Resp: 16  Temp: 98 F (36.7 C)  SpO2: 97%       Assessment & Plan:   Cough productive purulent sputum, phlegm in throat, strep throat exposure - due to persistence of cough we will get chest x-ray in clinic.  Also due to production of purulent sputum as well as strep throat exposure we will cover patient with Augmentin that would treat any sort of strep throat as well as a pneumonia.  Patient will start taking a daily allergy medicine such as Claritin Allegra or Zyrtec to see if this helps dry up the phlegm in  throat/postnasal drip.  Patient also will use albuterol inhaler, advised to take 2 puffs twice per day for the next week, then use only if needed.  I am hoping that use of the inhaler will help the cough and congestion symptoms improve.  Patient will let us know if he is not improving over the next 2 weeks.  Otherwise advised to keep regularly scheduled follow-up with PCP as planned.

## 2018-08-18 NOTE — Patient Instructions (Signed)
Also take a daily claritin, allegra or zyrtec to see if this helps symptoms

## 2018-11-08 ENCOUNTER — Telehealth: Payer: Self-pay

## 2018-11-08 NOTE — Telephone Encounter (Signed)
Copied from CRM [id number]. Topic: General - Other >> Nov 08, 2018  3:27 PM Lorrine Kin, NT wrote: Reason for CRM: Patient returning call to [facility] regarding his appointment on 11/13/2018. Patient states that he is interested in a virtual visit with Dr Birdie Sons. Please advise. Email confirmed. [email or web address]

## 2018-11-10 ENCOUNTER — Ambulatory Visit: Payer: BLUE CROSS/BLUE SHIELD | Admitting: Family Medicine

## 2018-11-13 ENCOUNTER — Encounter: Payer: Self-pay | Admitting: Family Medicine

## 2018-11-13 ENCOUNTER — Telehealth: Payer: Self-pay | Admitting: Family Medicine

## 2018-11-13 ENCOUNTER — Other Ambulatory Visit: Payer: Self-pay

## 2018-11-13 ENCOUNTER — Ambulatory Visit (INDEPENDENT_AMBULATORY_CARE_PROVIDER_SITE_OTHER): Payer: BLUE CROSS/BLUE SHIELD | Admitting: Family Medicine

## 2018-11-13 DIAGNOSIS — J989 Respiratory disorder, unspecified: Secondary | ICD-10-CM

## 2018-11-13 DIAGNOSIS — E669 Obesity, unspecified: Secondary | ICD-10-CM | POA: Insufficient documentation

## 2018-11-13 DIAGNOSIS — E039 Hypothyroidism, unspecified: Secondary | ICD-10-CM

## 2018-11-13 NOTE — Assessment & Plan Note (Signed)
He will continue on Synthroid.  He will come in for lab work in 1 month.  Orders placed.

## 2018-11-13 NOTE — Telephone Encounter (Signed)
Please contact the patient and get him set up for lab work in 1 month.  Orders placed.  He will need 73-month follow-up in the office as well.

## 2018-11-13 NOTE — Progress Notes (Signed)
Virtual Visit via Video Note  This visit type was conducted due to national recommendations for restrictions regarding the COVID-19 pandemic (e.g. social distancing).  This format is felt to be most appropriate for this patient at this time.  All issues noted in this document were discussed and addressed.  No physical exam was performed (except for noted visual exam findings with Video Visits).   I connected with Warren Harrison on 11/13/18 at  3:30 PM EDT by a video enabled telemedicine application or telephone and verified that I am speaking with the correct person using two identifiers. Location patient: home Location provider: work Persons participating in the virtual visit: patient, provider  I discussed the limitations, risks, security and privacy concerns of performing an evaluation and management service by telephone and the availability of in person appointments. I also discussed with the patient that there may be a patient responsible charge related to this service. The patient expressed understanding and agreed to proceed.   Reason for visit: follow-up   HPI: HYPOTHYROIDISM Disease Monitoring Weight changes: trending down since starting on synthroid and with exercise changes  Skin Changes: no Heat/Cold intolerance: no  Medication Monitoring Compliance:  Taking synthroid   Last TSH:   Lab Results  Component Value Date   TSH 3.91 05/09/2018   Respiratory illness: Patient notes he got sick with cough and congestion last November and this did not resolve completely until March.  He notes it improved once the weather became warmer.  He had treatment with a Z-Pak at first with no benefit and then received a steroid, Augmentin, and albuterol.  He notes that helped quite a bit more though he did continue to have nighttime coughing.  He has not had issues since the beginning of March.  Obesity: Patient notes his weight went up about 15 pounds prior to going on the Synthroid.  He does  note he had some issues getting exercise when he had his respiratory illness.  Since his respiratory illness has improved he has started walking 2 miles a day Monday through Friday and he goes for bike rides up to 5 hours on the weekends.  No soda or sweet tea.  Diet is generally healthy though he does lance cookies, potato chips, and peanut butter pretzels.  ROS: See pertinent positives and negatives per HPI.  Past Medical History:  Diagnosis Date  . Chicken pox     Past Surgical History:  Procedure Laterality Date  . CLAVICLE SURGERY    . HAND SURGERY      Family History  Problem Relation Age of Onset  . Alcoholism Unknown        parent  . Arthritis Unknown        parent and grandparent  . Cancer Unknown        ovarian/uterine - sister  . Breast cancer Unknown        mother  . Heart disease Unknown        parent  . Stroke Unknown        grandparent  . Other Unknown        intestinal issues - parent with rupture of intestines, paternal grandparents with impaction followed by intestinal rupture, cousin with diverticulitis and intestinal rupture - all died from these issues    SOCIAL HX: Non-smoker.   Current Outpatient Medications:  .  albuterol (PROVENTIL HFA;VENTOLIN HFA) 108 (90 Base) MCG/ACT inhaler, Take 2 puffs 2 times per day and every 6 hours as needed for 1 week, then  use every 6 hours as needed, Disp: 1 Inhaler, Rfl: 0 .  levothyroxine (SYNTHROID, LEVOTHROID) 50 MCG tablet, Take 1 tablet (50 mcg total) by mouth daily., Disp: 90 tablet, Rfl: 1  EXAM:  VITALS per patient if applicable: none.  GENERAL: alert, oriented, appears well and in no acute distress  HEENT: atraumatic, conjunttiva clear, no obvious abnormalities on inspection of external nose and ears  NECK: normal movements of the head and neck  LUNGS: on inspection no signs of respiratory distress, breathing rate appears normal, no obvious gross SOB, gasping or wheezing  CV: no obvious  cyanosis  MS: moves all visible extremities without noticeable abnormality  PSYCH/NEURO: pleasant and cooperative, no obvious depression or anxiety, speech and thought processing grossly intact  ASSESSMENT AND PLAN:  Discussed the following assessment and plan:  Hypothyroidism, unspecified type - Plan: TSH  Respiratory illness  Obesity (BMI 30.0-34.9)  Hypothyroidism He will continue on Synthroid.  He will come in for lab work in 1 month.  Orders placed.  Respiratory illness It sounds as though the patient had some component of reactive airway disease.  This could represent a seasonal asthma.  He is no longer having symptoms and thus he will continue to monitor.  If his symptoms recur he will contact us.  Obesity (BMI 30.0-34.9) Encouraged continued exercise.  Discussed decreasing his snack intake.   Counseling provided on social distancing precautions and sickness precautions with regards to COVID-19.   I discussed the assessment and treatment plan with the patient. The patient was provided an opportunity to ask questions and all were answered. The patient agreed with the plan and demonstrated an understanding of the instructions.   The patient was advised to call back or seek an in-person evaluation if the symptoms worsen or if the condition fails to improve as anticipated.   Marikay Alar, MD

## 2018-11-13 NOTE — Assessment & Plan Note (Signed)
Encouraged continued exercise.  Discussed decreasing his snack intake.

## 2018-11-13 NOTE — Assessment & Plan Note (Signed)
It sounds as though the patient had some component of reactive airway disease.  This could represent a seasonal asthma.  He is no longer having symptoms and thus he will continue to monitor.  If his symptoms recur he will contact us.

## 2018-11-14 NOTE — Telephone Encounter (Signed)
Called and spoke with pt. Pt is a [employer] employee and just new working there will need lab order sto go to [facility].   Did not schedule pt for lab work but I was unsure if he needs his labs drawn at [facility] or we just send the tubes to [facility].   Pt has been scheduled for 6 month follow up 05/16/2019 @ 8:30 AM

## 2018-11-14 NOTE — Addendum Note (Signed)
Addended by: Birdie Sons, Breeana Sawtelle G on: 11/14/2018 06:30 PM   Modules accepted: Orders

## 2018-11-14 NOTE — Addendum Note (Signed)
Addended by: Glori Luis on: 11/14/2018 06:32 PM   Modules accepted: Orders

## 2018-11-14 NOTE — Telephone Encounter (Signed)
He could have the labs drawn in our office and sent to [facility] or he could go to a [facility] draw station to have them done. I have placed the orders to be done in our office though they will need to be changed if he wants to go to [facility].

## 2018-11-15 NOTE — Telephone Encounter (Signed)
Order changed.

## 2018-11-15 NOTE — Addendum Note (Signed)
Addended by: Glori Luis on: 11/15/2018 04:21 PM   Modules accepted: Orders

## 2018-11-15 NOTE — Telephone Encounter (Signed)
Called and spoke with pt and pt would like to go to the [facility] patient service center   Address: [address], [place], [place] [address]  Phone: [phone]

## 2018-11-15 NOTE — Telephone Encounter (Signed)
Called pt and left a VM to call me back on my direct number to schedule for lab work.

## 2019-05-14 ENCOUNTER — Other Ambulatory Visit: Payer: Self-pay

## 2019-05-16 ENCOUNTER — Encounter: Payer: Self-pay | Admitting: Family Medicine

## 2019-05-16 ENCOUNTER — Ambulatory Visit (INDEPENDENT_AMBULATORY_CARE_PROVIDER_SITE_OTHER): Payer: Self-pay | Admitting: Family Medicine

## 2019-05-16 ENCOUNTER — Other Ambulatory Visit: Payer: Self-pay

## 2019-05-16 VITALS — BP 110/70 | HR 70 | Temp 97.4°F | Ht 67.0 in | Wt 197.8 lb

## 2019-05-16 DIAGNOSIS — E669 Obesity, unspecified: Secondary | ICD-10-CM

## 2019-05-16 DIAGNOSIS — F320 Major depressive disorder, single episode, mild: Secondary | ICD-10-CM

## 2019-05-16 DIAGNOSIS — Z23 Encounter for immunization: Secondary | ICD-10-CM

## 2019-05-16 DIAGNOSIS — E039 Hypothyroidism, unspecified: Secondary | ICD-10-CM

## 2019-05-16 NOTE — Progress Notes (Signed)
  Tommi Rumps, MD Phone: [phone]  Warren Harrison is a 48 y.o. male who presents today for f/u.  HYPOTHYROIDISM Disease Monitoring Weight changes: no  Skin Changes: no Heat/Cold intolerance: no    Palpitations: no  Medication Monitoring Compliance:  Not currently taking medication. Ran out 6 months ago and did not refill it.   Last TSH:   Lab Results  Component Value Date   TSH 3.91 05/09/2018   Obesity: Patient notes he is not been exercising quite as much.  He is walking 4 times a week.  His diet is awful.  He has trouble sticking to a good diet with out a normal routine.  Depression: Patient notes he has got a little bit of depression at this time.  This time of year is typically tough for him as his previous marriage fell apart about 10 years ago.  He notes in the past he has lost his self-awareness regarding the depression at a certain point though he has not gone there now.  He still functioning and going to work.  He has been on medication about 10 years ago though none since then.  He did therapy in 2017.  No SI.  Social History   Tobacco Use  Smoking Status Never Smoker  Smokeless Tobacco Never Used     ROS see history of present illness  Objective  Physical Exam Vitals:   05/16/19 0851  BP: 110/70  Pulse: 70  Temp: (!) 97.4 F (36.3 C)  SpO2: 97%    BP Readings from Last 3 Encounters:  05/16/19 110/70  08/18/18 98/60  07/14/18 104/68   Wt Readings from Last 3 Encounters:  05/16/19 197 lb 12.8 oz (89.7 kg)  08/18/18 194 lb 6.4 oz (88.2 kg)  07/14/18 195 lb (88.5 kg)    Physical Exam Constitutional:      General: He is not in acute distress.    Appearance: He is not diaphoretic.  Neck:     Thyroid: No thyroid mass, thyromegaly or thyroid tenderness.  Cardiovascular:     Rate and Rhythm: Normal rate and regular rhythm.     Heart sounds: Normal heart sounds.  Pulmonary:     Effort: Pulmonary effort is normal.     Breath sounds: Normal  breath sounds.  Musculoskeletal:     Right lower leg: No edema.     Left lower leg: No edema.  Skin:    General: Skin is warm and dry.  Neurological:     Mental Status: He is alert.      Assessment/Plan: Please see individual problem list.  Depression, major, single episode, mild ([place]) Patient with symptoms of mild depression.  Discussed medication or therapy though he deferred these at this time.  We will follow-up in about 3 months.  He will contact us if any worsening.  Advised to go to the emergency room for any suicidal ideation.  Hypothyroidism Check TSH.  Consider restarting medication based on labs.  Obesity (BMI 30.0-34.9) Encouraged continued activity.  Discussed dietary changes.  Check lab work.   Health Maintenance: Reports a Tdap in 2016.  Orders Placed This Encounter  Procedures  . TSH  . Comp Met (CMET)  . Lipid panel  . HgB A1c    No orders of the defined types were placed in this encounter.    Tommi Rumps, MD [place]

## 2019-05-16 NOTE — Assessment & Plan Note (Signed)
Check TSH.  Consider restarting medication based on labs.

## 2019-05-16 NOTE — Patient Instructions (Signed)
Nice to see you. We will check lab work today and contact you with the results. Please continue with exercise. Please monitor your depression and if it worsens please contact your therapist or [place].  We can consider medication if you would like.

## 2019-05-16 NOTE — Assessment & Plan Note (Signed)
Patient with symptoms of mild depression.  Discussed medication or therapy though he deferred these at this time.  We will follow-up in about 3 months.  He will contact us if any worsening.  Advised to go to the emergency room for any suicidal ideation.

## 2019-05-16 NOTE — Assessment & Plan Note (Signed)
Encouraged continued activity.  Discussed dietary changes.  Check lab work.

## 2019-05-17 ENCOUNTER — Other Ambulatory Visit: Payer: Self-pay | Admitting: Family Medicine

## 2019-05-17 DIAGNOSIS — R7989 Other specified abnormal findings of blood chemistry: Secondary | ICD-10-CM

## 2019-05-17 LAB — COMPREHENSIVE METABOLIC PANEL
ALT: 26 IU/L (ref 0–44)
AST: 24 IU/L (ref 0–40)
Albumin/Globulin Ratio: 2.1 (ref 1.2–2.2)
Albumin: 4.5 g/dL (ref 4.0–5.0)
Alkaline Phosphatase: 39 IU/L (ref 39–117)
BUN/Creatinine Ratio: 15 (ref 9–20)
BUN: 12 mg/dL (ref 6–24)
Bilirubin Total: 0.3 mg/dL (ref 0.0–1.2)
CO2: 22 mmol/L (ref 20–29)
Calcium: 9.4 mg/dL (ref 8.7–10.2)
Chloride: 101 mmol/L (ref 96–106)
Creatinine, Ser: 0.79 mg/dL (ref 0.76–1.27)
GFR calc Af Amer: 124 mL/min/{1.73_m2} (ref >59)
GFR calc non Af Amer: 108 mL/min/{1.73_m2} (ref >59)
Globulin, Total: 2.1 g/dL (ref 1.5–4.5)
Glucose: 94 mg/dL (ref 65–99)
Potassium: 4.3 mmol/L (ref 3.5–5.2)
Sodium: 140 mmol/L (ref 134–144)
Total Protein: 6.6 g/dL (ref 6.0–8.5)

## 2019-05-17 LAB — LIPID PANEL
Chol/HDL Ratio: 2.3 ratio (ref 0.0–5.0)
Cholesterol, Total: 165 mg/dL (ref 100–199)
HDL: 71 mg/dL (ref >39)
LDL Chol Calc (NIH): 82 mg/dL (ref 0–99)
Triglycerides: 60 mg/dL (ref 0–149)
VLDL Cholesterol Cal: 12 mg/dL (ref 5–40)

## 2019-05-17 LAB — HEMOGLOBIN A1C
Est. average glucose Bld gHb Est-mCnc: 117 mg/dL
Hgb A1c MFr Bld: 5.7 % — ABNORMAL HIGH (ref 4.8–5.6)

## 2019-05-17 LAB — TSH: TSH: 4.55 u[IU]/mL — ABNORMAL HIGH (ref 0.450–4.500)

## 2019-05-24 LAB — SPECIMEN STATUS REPORT

## 2019-05-25 LAB — T4, FREE: Free T4: 0.98 ng/dL (ref 0.82–1.77)

## 2019-05-25 LAB — SPECIMEN STATUS REPORT

## 2019-08-09 NOTE — Progress Notes (Signed)
I mailed the patient his lab results after not being able to reach by phone.  Aeric Burnham,cma

## 2019-08-17 ENCOUNTER — Encounter: Payer: Self-pay | Admitting: Family Medicine

## 2019-08-17 ENCOUNTER — Ambulatory Visit (INDEPENDENT_AMBULATORY_CARE_PROVIDER_SITE_OTHER): Payer: BC Managed Care – PPO | Admitting: Family Medicine

## 2019-08-17 ENCOUNTER — Other Ambulatory Visit: Payer: Self-pay

## 2019-08-17 DIAGNOSIS — F320 Major depressive disorder, single episode, mild: Secondary | ICD-10-CM

## 2019-08-17 DIAGNOSIS — E039 Hypothyroidism, unspecified: Secondary | ICD-10-CM

## 2019-08-17 DIAGNOSIS — E038 Other specified hypothyroidism: Secondary | ICD-10-CM

## 2019-08-17 DIAGNOSIS — Z1283 Encounter for screening for malignant neoplasm of skin: Secondary | ICD-10-CM

## 2019-08-17 NOTE — Progress Notes (Signed)
Virtual Visit via video Note  This visit type was conducted due to national recommendations for restrictions regarding the COVID-19 pandemic (e.g. social distancing).  This format is felt to be most appropriate for this patient at this time.  All issues noted in this document were discussed and addressed.  No physical exam was performed (except for noted visual exam findings with Video Visits).   I connected with Varney Daily today at  9:30 AM EST by a video enabled telemedicine application or telephone and verified that I am speaking with the correct person using two identifiers. Location patient: home Location provider: work Persons participating in the virtual visit: patient, provider  I discussed the limitations, risks, security and privacy concerns of performing an evaluation and management service by telephone and the availability of in person appointments. I also discussed with the patient that there may be a patient responsible charge related to this service. The patient expressed understanding and agreed to proceed.  Reason for visit: follow-up  HPI: Depression: Patient notes he had a bad week the week of the election in November.  Once everything calmed down related to that his mood improved.  He does note a low level of depression most of the time.  He notes no SI.  He is not interested in treatment currently unless he has recurrent dips in his mood.  Subclinical hypothyroidism: Patient's lab work revealed subclinical hypothyroidism on last visit.  He had been off of Synthroid for 6 months at that time.  He notes no fatigue, dry skin, cold intolerance, or constipation.  Skin check: The patient would like to be referred to a dermatologist for skin check.  His wife had a fairly large lesion removed and noted she would like him to be evaluated to ensure he has no lesions.  He notes no specific suspicious lesions though he does note sun exposure when he was younger.  ROS: See  pertinent positives and negatives per HPI.  Past Medical History:  Diagnosis Date  . Chicken pox     Past Surgical History:  Procedure Laterality Date  . CLAVICLE SURGERY    . HAND SURGERY      Family History  Problem Relation Age of Onset  . Alcoholism Unknown        parent  . Arthritis Unknown        parent and grandparent  . Cancer Unknown        ovarian/uterine - sister  . Breast cancer Unknown        mother  . Heart disease Unknown        parent  . Stroke Unknown        grandparent  . Other Unknown        intestinal issues - parent with rupture of intestines, paternal grandparents with impaction followed by intestinal rupture, cousin with diverticulitis and intestinal rupture - all died from these issues    SOCIAL HX: no wmoker   Current Outpatient Medications:  .  albuterol (PROVENTIL HFA;VENTOLIN HFA) 108 (90 Base) MCG/ACT inhaler, Take 2 puffs 2 times per day and every 6 hours as needed for 1 week, then use every 6 hours as needed, Disp: 1 Inhaler, Rfl: 0 .  levothyroxine (SYNTHROID, LEVOTHROID) 50 MCG tablet, Take 1 tablet (50 mcg total) by mouth daily., Disp: 90 tablet, Rfl: 1  EXAM:  VITALS per patient if applicable:  GENERAL: alert, oriented, appears well and in no acute distress  HEENT: atraumatic, conjunttiva clear, no obvious abnormalities on  inspection of external nose and ears  NECK: normal movements of the head and neck  LUNGS: on inspection no signs of respiratory distress, breathing rate appears normal, no obvious gross SOB, gasping or wheezing  CV: no obvious cyanosis  MS: moves all visible extremities without noticeable abnormality  PSYCH/NEURO: pleasant and cooperative, no obvious depression or anxiety, speech and thought processing grossly intact  ASSESSMENT AND PLAN:  Discussed the following assessment and plan:  Subclinical hypothyroidism Asymptomatic.  We will periodically recheck his labs.  No treatment needed at this  time.  Depression, major, single episode, mild (H[facility]) Mild symptoms.  He declines treatment at this time.  He will monitor and let us know if he would like to proceed with medication or therapy in the future.  Skin exam, screening for cancer Refer to dermatology.   Orders Placed This Encounter  Procedures  . Ambulatory referral to Dermatology    Referral Priority:   Routine    Referral Type:   Consultation    Referral Reason:   Specialty Services Required    Requested Specialty:   Dermatology    Number of Visits Requested:   1    No orders of the defined types were placed in this encounter.    I discussed the assessment and treatment plan with the patient. The patient was provided an opportunity to ask questions and all were answered. The patient agreed with the plan and demonstrated an understanding of the instructions.   The patient was advised to call back or seek an in-person evaluation if the symptoms worsen or if the condition fails to improve as anticipated.   Tommi Rumps, MD

## 2019-08-17 NOTE — Assessment & Plan Note (Signed)
Mild symptoms.  He declines treatment at this time.  He will monitor and let us know if he would like to proceed with medication or therapy in the future.

## 2019-08-17 NOTE — Assessment & Plan Note (Signed)
Refer to dermatology 

## 2019-08-17 NOTE — Assessment & Plan Note (Signed)
Asymptomatic.  We will periodically recheck his labs.  No treatment needed at this time.

## 2019-08-22 ENCOUNTER — Ambulatory Visit: Payer: Self-pay | Admitting: Family Medicine

## 2019-12-14 ENCOUNTER — Other Ambulatory Visit: Payer: Self-pay

## 2019-12-14 ENCOUNTER — Telehealth (INDEPENDENT_AMBULATORY_CARE_PROVIDER_SITE_OTHER): Payer: BC Managed Care – PPO | Admitting: Family Medicine

## 2019-12-14 DIAGNOSIS — F320 Major depressive disorder, single episode, mild: Secondary | ICD-10-CM | POA: Diagnosis not present

## 2019-12-14 DIAGNOSIS — Z3009 Encounter for other general counseling and advice on contraception: Secondary | ICD-10-CM | POA: Insufficient documentation

## 2019-12-14 DIAGNOSIS — E669 Obesity, unspecified: Secondary | ICD-10-CM

## 2019-12-14 MED ORDER — ESCITALOPRAM OXALATE 10 MG PO TABS: 10.0000 mg | ORAL_TABLET | Freq: Every day | ORAL | 3 refills | Status: DC

## 2019-12-14 NOTE — Assessment & Plan Note (Signed)
Continues to be an issue.  We will prescribe Lexapro for him.  Advised to seek medical attention if he developed SI.  Discussed risk of weight gain and sexual dysfunction with this medicine and if those happen to let us know.  Discussed that it may take 6 to 8 weeks for him to notice a big benefit.  Follow-up around that time.

## 2019-12-14 NOTE — Assessment & Plan Note (Signed)
Refer to urology.  ?

## 2019-12-14 NOTE — Assessment & Plan Note (Signed)
Encouraged adding an additional exercise and continue to work on his diet.

## 2019-12-14 NOTE — Progress Notes (Signed)
Virtual Visit via video Note  This visit type was conducted due to national recommendations for restrictions regarding the COVID-19 pandemic (e.g. social distancing).  This format is felt to be most appropriate for this patient at this time.  All issues noted in this document were discussed and addressed.  No physical exam was performed (except for noted visual exam findings with Video Visits).   I connected with Warren Harrison today at 10:30 AM EDT by a video enabled telemedicine application and verified that I am speaking with the correct person using two identifiers. Location patient: home Location provider: work Persons participating in the virtual visit: patient, provider  I discussed the limitations, risks, security and privacy concerns of performing an evaluation and management service by telephone and the availability of in person appointments. I also discussed with the patient that there may be a patient responsible charge related to this service. The patient expressed understanding and agreed to proceed.  Reason for visit: f/u.  HPI: Depression: Notes this has gotten worse over the past week or so.  He got to the point where his internal response was that this is not okay.  He denies SI.  Over the last day or so the depression has been less bad.  He has a lot going on with his family and work.  His father-in-law has been in and out of the hospital.  He got a promotion in January and just recently hired a second person to help him with the 3 jobs that he was doing so things may get better at work.  Obesity: Notes his weight has been stable.  His diet has been a little bit better with less junk food though he is eating out some.  Exercise has decreased relating to his schedule with everything that has been going on at home.  He has started to do more yard work which 2 to 3 hours a week.  Vasectomy evaluation: Patient would like to have a vasectomy.  He is done having kids.  He has 2  children.   ROS: See pertinent positives and negatives per HPI.  Past Medical History:  Diagnosis Date  . Chicken pox     Past Surgical History:  Procedure Laterality Date  . CLAVICLE SURGERY    . HAND SURGERY      Family History  Problem Relation Age of Onset  . Alcoholism Unknown        parent  . Arthritis Unknown        parent and grandparent  . Cancer Unknown        ovarian/uterine - sister  . Breast cancer Unknown        mother  . Heart disease Unknown        parent  . Stroke Unknown        grandparent  . Other Unknown        intestinal issues - parent with rupture of intestines, paternal grandparents with impaction followed by intestinal rupture, cousin with diverticulitis and intestinal rupture - all died from these issues    SOCIAL HX: Non-smoker   Current Outpatient Medications:  .  albuterol (PROVENTIL HFA;VENTOLIN HFA) 108 (90 Base) MCG/ACT inhaler, Take 2 puffs 2 times per day and every 6 hours as needed for 1 week, then use every 6 hours as needed, Disp: 1 Inhaler, Rfl: 0 .  levothyroxine (SYNTHROID, LEVOTHROID) 50 MCG tablet, Take 1 tablet (50 mcg total) by mouth daily., Disp: 90 tablet, Rfl: 1 .  escitalopram (  LEXAPRO) 10 MG tablet, Take 1 tablet (10 mg total) by mouth daily., Disp: 30 tablet, Rfl: 3  EXAM:  VITALS per patient if applicable:  GENERAL: alert, oriented, appears well and in no acute distress  HEENT: atraumatic, conjunttiva clear, no obvious abnormalities on inspection of external nose and ears  NECK: normal movements of the head and neck  LUNGS: on inspection no signs of respiratory distress, breathing rate appears normal, no obvious gross SOB, gasping or wheezing  CV: no obvious cyanosis  MS: moves all visible extremities without noticeable abnormality  PSYCH/NEURO: pleasant and cooperative, no obvious depression or anxiety, speech and thought processing grossly intact  ASSESSMENT AND PLAN:  Discussed the following assessment  and plan:  Depression, major, single episode, mild (HCC) Continues to be an issue.  We will prescribe Lexapro for him.  Advised to seek medical attention if he developed SI.  Discussed risk of weight gain and sexual dysfunction with this medicine and if those happen to let us know.  Discussed that it may take 6 to 8 weeks for him to notice a big benefit.  Follow-up around that time.  Obesity (BMI 30.0-34.9) Encouraged adding an additional exercise and continue to work on his diet.  Vasectomy evaluation Refer to urology.   Orders Placed This Encounter  Procedures  . Ambulatory referral to Urology    Referral Priority:   Routine    Referral Type:   Consultation    Referral Reason:   Specialty Services Required    Requested Specialty:   Urology    Number of Visits Requested:   1    Meds ordered this encounter  Medications  . escitalopram (LEXAPRO) 10 MG tablet    Sig: Take 1 tablet (10 mg total) by mouth daily.    Dispense:  30 tablet    Refill:  3     I discussed the assessment and treatment plan with the patient. The patient was provided an opportunity to ask questions and all were answered. The patient agreed with the plan and demonstrated an understanding of the instructions.   The patient was advised to call back or seek an in-person evaluation if the symptoms worsen or if the condition fails to improve as anticipated.    Marikay Alar, MD

## 2020-01-09 ENCOUNTER — Ambulatory Visit: Payer: BC Managed Care – PPO | Admitting: Urology

## 2020-01-09 ENCOUNTER — Encounter: Payer: Self-pay | Admitting: Urology

## 2020-01-09 ENCOUNTER — Other Ambulatory Visit: Payer: Self-pay

## 2020-01-09 VITALS — BP 109/72 | HR 66 | Ht 67.0 in | Wt 199.0 lb

## 2020-01-09 DIAGNOSIS — Z3009 Encounter for other general counseling and advice on contraception: Secondary | ICD-10-CM

## 2020-01-09 MED ORDER — DIAZEPAM 5 MG PO TABS: 5.0000 mg | ORAL_TABLET | Freq: Once | ORAL | 0 refills | Status: DC | PRN

## 2020-01-09 NOTE — Progress Notes (Signed)
01/09/20 12:48 PM   Varney Daily 25-Jun-1971 [id number]  CC: Discuss vasectomy  HPI: Mr. Geissinger is a healthy 49 year old male with 2 children ages 61 and 5, and he and his partner do not desire any further biologic children.  He works in Consulting civil engineer.   PMH: Past Medical History:  Diagnosis Date  . Chicken pox     Surgical History: Past Surgical History:  Procedure Laterality Date  . CLAVICLE SURGERY    . HAND SURGERY      Family History: Family History  Problem Relation Age of Onset  . Alcoholism Other        parent  . Arthritis Other        parent and grandparent  . Cancer Other        ovarian/uterine - sister  . Breast cancer Other        mother  . Heart disease Other        parent  . Stroke Other        grandparent  . Other Other        intestinal issues - parent with rupture of intestines, paternal grandparents with impaction followed by intestinal rupture, cousin with diverticulitis and intestinal rupture - all died from these issues    Social History:  reports that he has never smoked. He has never used smokeless tobacco. He reports current alcohol use of about 7.0 - 14.0 standard drinks of alcohol per week. He reports that he does not use drugs.  Physical Exam: BP 109/72   Pulse 66   Ht 5\' 7"  (1.702 m)   Wt 199 lb (90.3 kg)   BMI 31.17 kg/m    Constitutional:  Alert and oriented, No acute distress. Cardiovascular: No clubbing, cyanosis, or edema. Respiratory: Normal respiratory effort, no increased work of breathing. GI: Abdomen is soft, nontender, nondistended, no abdominal masses GU: Testicles 20 cc and descended bilaterally without masses, vas deferens easily palpable bilaterally  Assessment & Plan:   In summary, he is a healthy 49 year old male who desires vasectomy for permanent sterilization.  We discussed the risks and benefits of vasectomy at length.  Vasectomy is intended to be a permanent form of contraception, and does not produce  immediate sterility.  Following vasectomy another form of contraception is required until vas occlusion is confirmed by a post-vasectomy semen analysis obtained 2-3 months after the procedure.  Even after vas occlusion is confirmed, vasectomy is not 100% reliable in preventing pregnancy, and the failure rate is approximately 08/1998.  Repeat vasectomy is required in less than 1% of patients.  He should refrain from ejaculation for 1 week after vasectomy.  Options for fertility after vasectomy include vasectomy reversal, and sperm retrieval with in vitro fertilization or ICSI.  These options are not always successful and may be expensive.  Finally, there are other permanent and non-permanent alternatives to vasectomy available. There is no risk of erectile dysfunction, and the volume of semen will be similar to prior, as the majority of the ejaculate is from the prostate and seminal vesicles.   The procedure takes ~20 minutes.  We recommend patients take 5-10 mg of Valium 30 minutes prior, and he will need a driver post-procedure.  Local anesthetic is injected into the scrotal skin and a small segment of the vas deferens is removed, and the ends occluded. The complication rate is approximately 1-2%, and includes bleeding, infection, and development of chronic scrotal pain.  PLAN: Pending insurance approval, will schedule for vasectomy at his convenience  Nickolas Madrid, MD 01/09/2020  [facility] [address], [place], [place] [address] [phone]

## 2020-01-09 NOTE — Patient Instructions (Signed)
Pre-Vasectomy Instructions  STOP all aspirin or blood thinners (Aspirin, Plavix, Coumadin, Warfarin, Motrin, Ibuprofen, Advil, Aleve, Naproxen, Naprosyn) for 7 days prior to the procedure.  If you have any questions about stopping these medications please contact your primary care physician or cardiologist.  Shave all hair from the upper scrotum on the day of the procedure.  This means just under the penis onto the scrotal sac.  The area shaved should measure about 2-3 inches around.  You may lather the scrotum with soap and water, and shave with a safety razor.  After shaving the area, thoroughly wash the penis and the scrotum, then shower or bathe to remove all the loose hairs.  If needed, wash the area again just before coming in for your circumcision.  It is recommended to have a light meal an hour or so prior to the procedure.  Bring a scrotal support (jock strap or suspensory, or tight jockey shorts or underwear).  Wear comfortable pants or shorts.  While the actual procedure usually takes about 45 minutes, you should be prepared to stay in the office for approximately one hour.  Bring someone with you to drive you home.  If you have any questions or concerns, please feel free to call the office at [phone].    Vasectomy Vasectomy is a procedure in which the tube that carries sperm from the testicle to the urethra (vas deferens) is tied. It may also be cut. The procedure blocks sperm from going through the vas deferens and penis during ejaculation. This ensures that sperm does not go into the vagina during sex. Vasectomy does not affect your sexual desire or performance, and does not prevent sexually transmitted diseases. Vasectomy is considered a permanent and very effective form of birth control (contraception). The decision to have a vasectomy should not be made during a stressful situation, such as after the loss of a pregnancy or a divorce. You and your partner should make the  decision to have a vasectomy when you are sure that you do not want children in the future. Tell a health care provider about:  Any allergies you have.  All medicines you are taking, including vitamins, herbs, eye drops, creams, and over-the-counter medicines.  Any problems you or family members have had with anesthetic medicines.  Any blood disorders you have.  Any surgeries you have had.  Any medical conditions you have. What are the risks? Generally, this is a safe procedure. However, problems may occur, including:  Infection.  Bleeding and swelling of the scrotum.  Allergic reactions to medicines.  Failure of the procedure to prevent pregnancy. There is a very small chance that the cut ends of the vas deferens may reconnect (recanalization), meaning that you could still make a woman pregnant.  Pain in the scrotum that continues after healing from the procedure. What happens before the procedure?  Ask your health care provider about: ? Changing or stopping your regular medicines. This is especially important if you are taking diabetes medicines or blood thinners. ? Taking over-the-counter medicines, vitamins, herbs, and supplements. ? Taking medicines such as aspirin and ibuprofen. These medicines can thin your blood. Do not take these medicines unless your health care provider tells you to take them.  You may be asked to shower with a germ-killing soap.  Plan to have someone take you home from the hospital or clinic. What happens during the procedure?   To lower your risk of infection: ? Your health care team will wash or   sanitize their hands. ? Hair may be removed from the surgical area. ? Your scrotum will be washed with soap.  You will be given one or more of the following: ? A medicine to help you relax (sedative). You may be instructed to take this a few hours before the procedure. ? A medicine to numb the area (local anesthetic).  Your health care provider  will feel (palpate) for your vas deferens.  To reach the vas deferens, one of two methods may be used: ? A very small incision may be made in your scrotum. ? A punctured opening may be made in your scrotum, without an incision.  Your vas deferens will be pulled out of your scrotum, and may be: ? Tied off. ? Cut and possibly burned (cauterized) at the ends to seal them off.  The vas deferens will be put back into your scrotum.  The incision or puncture opening will be closed with absorbable stitches (sutures). The sutures will eventually dissolve and will not need to be removed after the procedure. The procedure may vary among health care providers and hospitals. What happens after the procedure?  You will be monitored to make sure that you do not experience problems.  You will be asked not to ejaculate for at least 1 week after the procedure, or as long as directed.  You will need to use a different form of contraception for 2-4 months after the procedure, until you have test results confirming that there are no sperm in your semen.  You may be given scrotal support to wear, such as a jock strap or underwear with a supportive pouch.  Do not drive for 24 hours if you were given a sedative to help you relax. Summary  Vasectomy is considered a permanent and very effective form of birth control (contraception). The procedure prevents sperm from being released during ejaculation.  Your scrotum will be numbed with medicine (local anesthetic) for the procedure.  After the procedure, you will be asked not to ejaculate for at least 1 week, or for as long as directed. You will also need to use a different form of contraception until your health care provider examines you and finds that there are no sperm in your semen. This information is not intended to replace advice given to you by your health care provider. Make sure you discuss any questions you have with your health care  provider. Document Revised: 01/20/2018 Document Reviewed: 10/15/2016 Elsevier Patient Education  2020 [employer]  

## 2020-02-07 ENCOUNTER — Ambulatory Visit: Payer: BC Managed Care – PPO | Admitting: Urology

## 2020-02-07 ENCOUNTER — Other Ambulatory Visit: Payer: Self-pay

## 2020-02-07 ENCOUNTER — Encounter: Payer: Self-pay | Admitting: Urology

## 2020-02-07 VITALS — BP 105/70 | HR 66 | Ht 67.0 in | Wt 199.0 lb

## 2020-02-07 DIAGNOSIS — Z302 Encounter for sterilization: Secondary | ICD-10-CM

## 2020-02-07 HISTORY — PX: VASECTOMY: SHX75

## 2020-02-07 NOTE — Patient Instructions (Signed)
Vasectomy, Care After °This sheet gives you information about how to care for yourself after your procedure. Your health care provider may also give you more specific instructions. If you have problems or questions, contact your health care provider. °What can I expect after the procedure? °After your procedure, it is common to have: °· Mild pain, swelling, redness, or discomfort in your scrotum. °· Some blood coming from your incisions or puncture sites for one or two days. °· Blood in your semen. °Follow these instructions at home: °Medicines ° °· Take over-the-counter and prescription medicines only as told by your health care provider. °· Avoid taking NSAIDs such as aspirin and ibuprofen, because these medicines can make bleeding worse. °Activity °· For the first 2 days after surgery, avoid physical activity and exercise that require a lot of energy. Ask your health care provider what activities are safe for you. °· Do not participate in sports or perform heavy physical labor until your pain has improved, or until your health care provider says it is okay. °· Do not ejaculate for at least 1 week after the procedure, or as long as directed. °· You may resume sexual activity 7-10 days after your procedure, or when your health care provider approves. Use a different method of birth control (contraception) until you have had test results that confirm that there is no sperm in your semen. °Scrotal support °· Use scrotal support, such as a jock strap or underwear with a supportive pouch, as needed for one week after your procedure. °· If you feel discomfort in your scrotum, you may remove the scrotal support to see if the discomfort is relieved. Sometimes scrotal support can press on the scrotum and cause or worsen discomfort. °· If your skin gets irritated, you may add some germ-free (sterile), fluffed bandages or a clean washcloth to the scrotal support. °General instructions °· Put ice on the injured area: °? Put  ice in a plastic bag. °? Place a towel between your skin and the bag. °? Leave the ice on for 20 minutes, 2-3 times a day. °· Check your incisions or puncture sites every day for signs of infection. Check for: °? Redness, swelling, or pain. °? Fluid or blood. °? Warmth. °? Pus or a bad smell. °· Leave stitches (sutures) in place. The sutures will dissolve on their own and do not need to be removed. °· Keep all follow-up visits as told by your health care provider. This is important because you will need a test to confirm that there is no sperm in your semen. Multiple ejaculations are needed to clear out sperm that were beyond the vasectomy site. You will need one test result showing that there is no sperm in your semen before you can resume unprotected sex. This may take 2-4 months after your procedure. °· Do not drive for 24 hours if you were given a sedative to help you relax. °Contact a health care provider if: °· You have redness, swelling, or more pain around your incision or puncture site, or in your scrotum area in general. °· You have bleeding from your incision or puncture site. °· You have pus or a bad smell coming from your incision or puncture site. °· You have a fever. °· Your incision or puncture site opens up. °Get help right away if: °· You develop a rash. °· You have difficulty breathing. °Summary °· After your procedure it is common to have mild pain, swelling, redness, or discomfort in your scrotum. °·   Avoid physical activity and exercise that requires a lot of energy for the first 2 days after surgery. °· Put ice on the injured area. Leave the ice on for 20 minutes, 2-3 times a day. °· Do not drive for 24 hours if you were given a sedative to help you relax. °This information is not intended to replace advice given to you by your health care provider. Make sure you discuss any questions you have with your health care provider. °Document Revised: 07/01/2017 Document Reviewed: 10/15/2016 °Elsevier  Patient Education © 2020 Else[employer] Inc. ° °

## 2020-02-07 NOTE — Progress Notes (Signed)
VASECTOMY PROCEDURE NOTE:  The patient was taken to the minor procedure room and placed in the supine position. His genitals were prepped and draped in the usual sterile fashion. The right vas deferens was brought up to the skin of the right upper scrotum. The skin overlying it was anesthetized with 1% lidocaine without epinephrine, anesthetic was also injected alongside the vas deferens in the direction of the inguinal canal. The no scalpel vasectomy instrument was used to make a small perforation in the scrotal skin. The vasectomy clamp was used to grasp the vas deferens. It was carefully dissected free from surrounding structures. A 1cm segment of the vas was removed, and the cut ends of the mucosa were cauterized. No significant bleeding was noted. The vas deferens was returned to the scrotum. The skin incision was closed with a simple interrupted stitch of 4-0 chromic.  Attention was then turned to the left side. The left vasectomy was performed in the same exact fashion. Sterile dressings were placed over each incision. The patient tolerated the procedure well.  IMPRESSION/DIAGNOSIS: The patient is a 49 year old gentleman who underwent a vasectomy today. Post-procedure instructions were reviewed. I stressed the importance of continuing to use birth control until he provides a semen specimen more than 2 months from now that demonstrates azoospermia.  We discussed return precautions including fever over 101, significant bleeding or hematoma, or uncontrolled pain. I also stressed the importance of avoiding strenuous activity for one week, no sexual activity or ejaculations for 5 days, intermittent icing over the next 48 hours, and scrotal support.   PLAN: The patient will be advised of his semen analysis results when available.   Legrand Rams, MD 02/07/2020

## 2020-05-09 ENCOUNTER — Other Ambulatory Visit: Payer: Self-pay

## 2020-05-09 ENCOUNTER — Encounter: Payer: Self-pay | Admitting: Urology

## 2020-06-04 ENCOUNTER — Ambulatory Visit: Payer: BC Managed Care – PPO

## 2020-06-04 ENCOUNTER — Telehealth: Payer: Self-pay | Admitting: Family Medicine

## 2020-06-04 ENCOUNTER — Ambulatory Visit (INDEPENDENT_AMBULATORY_CARE_PROVIDER_SITE_OTHER): Payer: BC Managed Care – PPO | Admitting: Nurse Practitioner

## 2020-06-04 ENCOUNTER — Other Ambulatory Visit: Payer: Self-pay

## 2020-06-04 ENCOUNTER — Encounter: Payer: Self-pay | Admitting: Nurse Practitioner

## 2020-06-04 VITALS — BP 110/80 | HR 67 | Temp 98.4°F | Ht 67.0 in | Wt 206.0 lb

## 2020-06-04 DIAGNOSIS — R079 Chest pain, unspecified: Secondary | ICD-10-CM

## 2020-06-04 DIAGNOSIS — Z23 Encounter for immunization: Secondary | ICD-10-CM | POA: Insufficient documentation

## 2020-06-04 NOTE — Addendum Note (Signed)
Addended by: Bonnell Public I on: 06/04/2020 12:51 PM   Modules accepted: Orders

## 2020-06-04 NOTE — Progress Notes (Addendum)
Established Patient Office Visit  Subjective:  Patient ID: Warren Harrison, male    DOB: 1970-09-29  Age: 49 y.o. MRN: [id number]  CC:  Chief Complaint  Patient presents with  . Acute Visit    acid reflux/heartburn    HPI Warren Harrison is a 49 year old male with medical history of subclinical hypothyroidism, obesity, depression, who comes in reporting 2 episodes of chest pain.    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   He reports that he had an episode in June while driving of 30 minutes of severe midsternal squeezing chest pain rated 9 out of 10.  This came on out of the blue without any association with eating or drinking.  This was a severe squeezing sensation that occurred from left to right, and radiated up into bilateral jaw and neck.  This was associated with difficulty taking a deep breath and he did feel short of breath.  This lasted 30 minutes.  He got out of the car when he got to the location, sat and rested it resolved.  He then went on to the camping site and proceeded to lift out equipment, set up tents, and had no further symptoms.  This does not have any association with exertion.  He has had no further events until this last Friday.  He was again driving in the car, and felt this tight squeezing severe again associated with shortness of breath that lasted about 20 minutes.  He had driven to 14 hours straight into [place].  He was able to get out of the car, and rested and it resolved.  About an hour and a half before this event he had eaten oatmeal.   RT lower leg size is noticeable larger than right. He reports baseline after cycling competitively for years. No change in years. No calf pain , cramps or tenderness. He did have a bad leg cramp in left leg. He gets leg cramps often.   He has had no problems with heartburn reflux or esophageal spasm.  He has no right upper quadrant  pain nausea or vomiting.  He has had no change in appetite diet or weight.  He does not typically have shortness of breath or DOE.  He can lay flat without problems.  His right leg is always larger than the left months but unchanged.  He has noted no calf pain or tenderness.  Denies any fevers or chills.  He has had no chest dizziness lightheadedness or weakness.  He had Covid vaccines in March and April.  He has not had the booster.    Past Medical History:  Diagnosis Date  . Chicken pox     Past Surgical History:  Procedure Laterality Date  . CLAVICLE SURGERY    . HAND SURGERY    . VASECTOMY  02/07/2020    Family History  Problem Relation Age of Onset  . Alcoholism Other        parent  . Arthritis Other        parent and grandparent  . Cancer Other        ovarian/uterine - sister  . Breast cancer Other        mother  . Heart disease Other        parent  . Stroke Other        grandparent  . Other Other  intestinal issues - parent with rupture of intestines, paternal grandparents with impaction followed by intestinal rupture, cousin with diverticulitis and intestinal rupture - all died from these issues    Social History   Socioeconomic History  . Marital status: Married    Spouse name: Not on file  . Number of children: Not on file  . Years of education: Not on file  . Highest education level: Not on file  Occupational History  . Not on file  Tobacco Use  . Smoking status: Never Smoker  . Smokeless tobacco: Never Used  Vaping Use  . Vaping Use: Never assessed  Substance and Sexual Activity  . Alcohol use: Yes    Alcohol/week: 7.0 - 14.0 standard drinks    Types: 7 - 14 Standard drinks or equivalent per week  . Drug use: No  . Sexual activity: Yes    Birth control/protection: Surgical    Comment: 02/07/2020-Vasectomy   Other Topics Concern  . Not on file  Social History Narrative  . Not on file   Social Determinants of Health   Financial Resource  Strain:   . Difficulty of Paying Living Expenses: Not on file  Food Insecurity:   . Worried About Charity fundraiser in the Last Year: Not on file  . Ran Out of Food in the Last Year: Not on file  Transportation Needs:   . Lack of Transportation (Medical): Not on file  . Lack of Transportation (Non-Medical): Not on file  Physical Activity:   . Days of Exercise per Week: Not on file  . Minutes of Exercise per Session: Not on file  Stress:   . Feeling of Stress : Not on file  Social Connections:   . Frequency of Communication with Friends and Family: Not on file  . Frequency of Social Gatherings with Friends and Family: Not on file  . Attends Religious Services: Not on file  . Active Member of Clubs or Organizations: Not on file  . Attends Archivist Meetings: Not on file  . Marital Status: Not on file  Intimate Partner Violence:   . Fear of Current or Ex-Partner: Not on file  . Emotionally Abused: Not on file  . Physically Abused: Not on file  . Sexually Abused: Not on file    Outpatient Medications Prior to Visit  Medication Sig Dispense Refill  . albuterol (PROVENTIL HFA;VENTOLIN HFA) 108 (90 Base) MCG/ACT inhaler Take 2 puffs 2 times per day and every 6 hours as needed for 1 week, then use every 6 hours as needed 1 Inhaler 0  . diazepam (VALIUM) 5 MG tablet Take 1 tablet (5 mg total) by mouth once as needed for up to 1 dose. 1 tablet 0   No facility-administered medications prior to visit.    Allergies  Allergen Reactions  . Neosporin [Neomycin-Bacitracin Zn-Polymyx] Hives    Review of Systems Pertinent positives noted in history of present illness and otherwise negative.    Objective:    Physical Exam Vitals reviewed.  Constitutional:      Appearance: Normal appearance.  HENT:     Head: Normocephalic and atraumatic.  Eyes:     Conjunctiva/sclera: Conjunctivae normal.     Pupils: Pupils are equal, round, and reactive to light.  Neck:     Vascular:  No carotid bruit.  Cardiovascular:     Rate and Rhythm: Regular rhythm. Bradycardia present.     Pulses: Normal pulses.     Heart sounds: Normal heart sounds.  Pulmonary:     Effort: Pulmonary effort is normal.     Breath sounds: Normal breath sounds.  Abdominal:     Palpations: Abdomen is soft.     Tenderness: There is no abdominal tenderness.  Musculoskeletal:        General: Normal range of motion.     Cervical back: Normal range of motion and neck supple.     Comments: RT lower leg size is noticeable larger than right. He reports baseline after cycling competitively for years.   Lymphadenopathy:     Cervical: No cervical adenopathy.  Skin:    General: Skin is warm and dry.  Neurological:     General: No focal deficit present.     Mental Status: He is alert and oriented to person, place, and time.  Psychiatric:        Mood and Affect: Mood normal.        Behavior: Behavior normal.     BP 110/80 (BP Location: Left Arm, Patient Position: Sitting, Cuff Size: Normal)   Pulse 67   Temp 98.4 F (36.9 C) (Oral)   Ht 5' 7"  (1.702 m)   Wt 206 lb (93.4 kg)   SpO2 97%   BMI 32.26 kg/m  Wt Readings from Last 3 Encounters:  06/04/20 206 lb (93.4 kg)  02/07/20 199 lb (90.3 kg)  01/09/20 199 lb (90.3 kg)   Pulse Readings from Last 3 Encounters:  06/04/20 67  02/07/20 66  01/09/20 66    BP Readings from Last 3 Encounters:  06/04/20 110/80  02/07/20 105/70  01/09/20 109/72    Lab Results  Component Value Date   CHOL 165 05/16/2019   HDL 71 05/16/2019   LDLCALC 82 05/16/2019   TRIG 60 05/16/2019   CHOLHDL 2.3 05/16/2019      Health Maintenance Due  Topic Date Due  . Hepatitis C Screening  Never done  . HIV Screening  Never done    There are no preventive care reminders to display for this patient.  Lab Results  Component Value Date   TSH 4.550 (H) 05/16/2019   Lab Results  Component Value Date   WBC 5.1 06/04/2020   HGB 14.9 06/04/2020   HCT 42.5  06/04/2020   MCV 94 06/04/2020   PLT 263 06/04/2020   Lab Results  Component Value Date   NA 141 06/04/2020   K 4.5 06/04/2020   CO2 24 06/04/2020   GLUCOSE 95 06/04/2020   BUN 11 06/04/2020   CREATININE 0.90 06/05/2020   BILITOT 0.4 06/04/2020   ALKPHOS 47 06/04/2020   AST 23 06/04/2020   ALT 26 06/04/2020   PROT 7.5 06/04/2020   ALBUMIN 5.0 06/04/2020   CALCIUM 9.8 06/04/2020   Lab Results  Component Value Date   CHOL 165 05/16/2019   Lab Results  Component Value Date   HDL 71 05/16/2019   Lab Results  Component Value Date   LDLCALC 82 05/16/2019   Lab Results  Component Value Date   TRIG 60 05/16/2019   Lab Results  Component Value Date   CHOLHDL 2.3 05/16/2019   Lab Results  Component Value Date   HGBA1C 5.7 (H) 05/16/2019      Assessment & Plan:   Problem List Items Addressed This Visit      Other   Chest pain - Primary   Relevant Orders   EKG 12-Lead   Ambulatory referral to Cardiology   CT Angio Chest W/Cm &/Or Wo Cm (Completed)  Comp Met (CMET) (Completed)   CBC with Differential/Platelet (Completed)   Need for immunization against influenza   Relevant Orders   Flu Vaccine QUAD 36+ mos IM (Completed)     He is a cyclist by history with baseline assymetry  in right leg> left by one inch. He gets frequent leg cramps. He had a long car trip associated with last week's episode. Marland KitchenAtypical severe CP x 2 events since June a/w driving and SOB. No exertional component.  Midsternal chest pain is described as a lateral squeezing sensation with radiation into the neck and jaw.  Associated with shortness of breath.  Duration is 20 to 30 minutes. Nonexertional.  Stat CT angio ordered because cyclists with leg assymetry have higher risk for PE.  EKG today for atypical chest pain shows Sinus Bradyardia with HR 59 and regular. and no ST T wave changes. No CP today.  Referral placed to Cardiology for further evaluation.  Follow-up: Return for TBD.   This  visit occurred during the SARS-CoV-2 public health emergency.  Safety protocols were in place, including screening questions prior to the visit, additional usage of staff PPE, and extensive cleaning of exam room while observing appropriate contact time as indicated for disinfecting solutions.    Denice Paradise, NP

## 2020-06-04 NOTE — Telephone Encounter (Signed)
lft vm for pt to call ofc about CT appt.

## 2020-06-04 NOTE — Patient Instructions (Addendum)
Your EKG today shows a shows a slightly slow heart rate but no abnormal changes.   Chest CT today to make sure lungs are Okay and no blood clots in the lung to cause this pain.   Referral to Cardiology.   Further recommendations pending CT chest results.

## 2020-06-05 ENCOUNTER — Telehealth: Payer: Self-pay | Admitting: Nurse Practitioner

## 2020-06-05 ENCOUNTER — Ambulatory Visit
Admission: RE | Admit: 2020-06-05 | Discharge: 2020-06-05 | Disposition: A | Discharge status: Home / Self Care | Payer: BC Managed Care – PPO | Source: Ambulatory Visit | Attending: Nurse Practitioner | Admitting: Nurse Practitioner

## 2020-06-05 DIAGNOSIS — R079 Chest pain, unspecified: Secondary | ICD-10-CM | POA: Diagnosis not present

## 2020-06-05 DIAGNOSIS — J9811 Atelectasis: Secondary | ICD-10-CM | POA: Diagnosis not present

## 2020-06-05 LAB — COMPREHENSIVE METABOLIC PANEL
ALT: 26 IU/L (ref 0–44)
AST: 23 IU/L (ref 0–40)
Albumin/Globulin Ratio: 2 (ref 1.2–2.2)
Albumin: 5 g/dL (ref 4.0–5.0)
Alkaline Phosphatase: 47 IU/L (ref 44–121)
BUN/Creatinine Ratio: 11 (ref 9–20)
BUN: 11 mg/dL (ref 6–24)
Bilirubin Total: 0.4 mg/dL (ref 0.0–1.2)
CO2: 24 mmol/L (ref 20–29)
Calcium: 9.8 mg/dL (ref 8.7–10.2)
Chloride: 100 mmol/L (ref 96–106)
Creatinine, Ser: 1.03 mg/dL (ref 0.76–1.27)
GFR calc Af Amer: 100 mL/min/{1.73_m2} (ref >59)
GFR calc non Af Amer: 86 mL/min/{1.73_m2} (ref >59)
Globulin, Total: 2.5 g/dL (ref 1.5–4.5)
Glucose: 95 mg/dL (ref 65–99)
Potassium: 4.5 mmol/L (ref 3.5–5.2)
Sodium: 141 mmol/L (ref 134–144)
Total Protein: 7.5 g/dL (ref 6.0–8.5)

## 2020-06-05 LAB — CBC WITH DIFFERENTIAL/PLATELET
Basophils Absolute: 0.1 10*3/uL (ref 0.0–0.2)
Basos: 1 %
EOS (ABSOLUTE): 0.2 10*3/uL (ref 0.0–0.4)
Eos: 3 %
Hematocrit: 42.5 % (ref 37.5–51.0)
Hemoglobin: 14.9 g/dL (ref 13.0–17.7)
Immature Grans (Abs): 0 10*3/uL (ref 0.0–0.1)
Immature Granulocytes: 0 %
Lymphocytes Absolute: 1.6 10*3/uL (ref 0.7–3.1)
Lymphs: 32 %
MCH: 32.8 pg (ref 26.6–33.0)
MCHC: 35.1 g/dL (ref 31.5–35.7)
MCV: 94 fL (ref 79–97)
Monocytes Absolute: 0.4 10*3/uL (ref 0.1–0.9)
Monocytes: 8 %
Neutrophils Absolute: 2.8 10*3/uL (ref 1.4–7.0)
Neutrophils: 56 %
Platelets: 263 10*3/uL (ref 150–450)
RBC: 4.54 x10E6/uL (ref 4.14–5.80)
RDW: 12.6 % (ref 11.6–15.4)
WBC: 5.1 10*3/uL (ref 3.4–10.8)

## 2020-06-05 LAB — POCT I-STAT CREATININE: Creatinine, Ser: 0.9 mg/dL (ref 0.61–1.24)

## 2020-06-05 MED ORDER — IOHEXOL 350 MG/ML SOLN
75.0000 mL | Freq: Once | INTRAVENOUS | Status: AC | PRN
  Administered 2020-06-05: 75 mL via INTRAVENOUS

## 2020-06-05 NOTE — Telephone Encounter (Signed)
I LMOM for pt to call to get Chest angio CT results:  I spoke with Dr. Margo Aye about the findings. He thinks it was an excellent study and was negative PE. No esophageal findings, or mediastinal findings.   We discussed the significance of the bilat patchy -dependent ground glass findings. Dr. Margo Aye thinks the pt did not take a deep breath and this is atelectasis and is not clinically significant. This does not need a follow up  imaging study. He does recommend cardiac evaluation for the pt's symptoms- which is already in process for tomorrow.

## 2020-06-06 ENCOUNTER — Encounter: Payer: Self-pay | Admitting: Cardiology

## 2020-06-06 ENCOUNTER — Other Ambulatory Visit: Payer: Self-pay

## 2020-06-06 ENCOUNTER — Ambulatory Visit: Payer: BC Managed Care – PPO | Admitting: Cardiology

## 2020-06-06 VITALS — BP 112/78 | HR 72 | Ht 67.0 in | Wt 208.0 lb

## 2020-06-06 DIAGNOSIS — R079 Chest pain, unspecified: Secondary | ICD-10-CM

## 2020-06-06 DIAGNOSIS — R072 Precordial pain: Secondary | ICD-10-CM

## 2020-06-06 MED ORDER — METOPROLOL TARTRATE 100 MG PO TABS: 100.0000 mg | ORAL_TABLET | Freq: Once | ORAL | 0 refills | Status: AC

## 2020-06-06 NOTE — Progress Notes (Signed)
Cardiology Office Note:    Date:  06/06/2020   ID:  Warren Harrison, DOB [birth year], MRN [id number]  PCP:  Leone Haven, MD  [facility] Cardiologist:  Kate Sable, MD  [place] Electrophysiologist:  None   Referring MD: Marval Regal, NP   Chief Complaint  Patient presents with  . New Patient (Initial Visit)    Establish care with provider for chest tightness a week ago. Medications verbally reviewed with patient.    Warren Harrison is a 49 y.o. male who is being seen today for the evaluation of chest pain at the request of Marval Regal, NP.   History of Present Illness:    Warren Harrison is a 49 y.o. male with no significant past medical history who presents due to chest pain.  Patient states having symptoms of chest pain beginning 4 to 5 months ago while he was driving.  Had chest tightness which he rates as 9 out of 10 in severity lasting about 20 minutes.  Symptoms were associated with diaphoresis.  He attributed symptoms to reflux.  Since then, he felt okay until 1 week ago when symptoms reoccurred, again while he was driving.  This time symptoms of chest tightness radiated to his neck, associated with diaphoresis and nausea.  He followed up with primary care provider where CTA was performed on 06/05/2020 which was negative for PE.  Patient denies any personal history of heart disease.  His father had CAD/stent in his early 63s.  Denies smoking, denies palpitations, denies edema.   Past Medical History:  Diagnosis Date  . Chicken pox     Past Surgical History:  Procedure Laterality Date  . CLAVICLE SURGERY    . HAND SURGERY    . VASECTOMY  02/07/2020    Current Medications: No outpatient medications have been marked as taking for the 06/06/20 encounter (Office Visit) with Kate Sable, MD.     Allergies:   Neosporin [neomycin-bacitracin zn-polymyx]   Social History   Socioeconomic History  . Marital status: Married    Spouse name:  Not on file  . Number of children: Not on file  . Years of education: Not on file  . Highest education level: Not on file  Occupational History  . Not on file  Tobacco Use  . Smoking status: Never Smoker  . Smokeless tobacco: Never Used  Vaping Use  . Vaping Use: Never assessed  Substance and Sexual Activity  . Alcohol use: Yes    Alcohol/week: 7.0 - 14.0 standard drinks    Types: 7 - 14 Standard drinks or equivalent per week  . Drug use: No  . Sexual activity: Yes    Birth control/protection: Surgical    Comment: 02/07/2020-Vasectomy   Other Topics Concern  . Not on file  Social History Narrative  . Not on file   Social Determinants of Health   Financial Resource Strain:   . Difficulty of Paying Living Expenses: Not on file  Food Insecurity:   . Worried About Charity fundraiser in the Last Year: Not on file  . Ran Out of Food in the Last Year: Not on file  Transportation Needs:   . Lack of Transportation (Medical): Not on file  . Lack of Transportation (Non-Medical): Not on file  Physical Activity:   . Days of Exercise per Week: Not on file  . Minutes of Exercise per Session: Not on file  Stress:   . Feeling of Stress : Not on file  Social  Connections:   . Frequency of Communication with Friends and Family: Not on file  . Frequency of Social Gatherings with Friends and Family: Not on file  . Attends Religious Services: Not on file  . Active Member of Clubs or Organizations: Not on file  . Attends Archivist Meetings: Not on file  . Marital Status: Not on file     Family History: The patient's family history includes Alcoholism in an other family member; Arthritis in an other family member; Breast cancer in an other family member; Cancer in an other family member; Heart disease in an other family member; Other in an other family member; Stroke in an other family member.  ROS:   Please see the history of present illness.     All other systems reviewed and  are negative.  EKGs/Labs/Other Studies Reviewed:    The following studies were reviewed today:   EKG:  EKG is  ordered today.  The ekg ordered today demonstrates sinus rhythm, normal ECG.  Recent Labs: 06/04/2020: ALT 26; BUN 11; Hemoglobin 14.9; Platelets 263; Potassium 4.5; Sodium 141 06/05/2020: Creatinine, Ser 0.90  Recent Lipid Panel    Component Value Date/Time   CHOL 165 05/16/2019 0915   TRIG 60 05/16/2019 0915   HDL 71 05/16/2019 0915   CHOLHDL 2.3 05/16/2019 0915   LDLCALC 82 05/16/2019 0915     Risk Assessment/Calculations:      Physical Exam:    VS:  BP 112/78 (BP Location: Right Arm, Patient Position: Sitting, Cuff Size: Normal)   Pulse 72   Ht 5' 7" (1.702 m)   Wt 208 lb (94.3 kg)   SpO2 98%   BMI 32.58 kg/m     Wt Readings from Last 3 Encounters:  06/06/20 208 lb (94.3 kg)  06/04/20 206 lb (93.4 kg)  02/07/20 199 lb (90.3 kg)     GEN:  Well nourished, well developed in no acute distress HEENT: Normal NECK: No JVD; No carotid bruits LYMPHATICS: No lymphadenopathy CARDIAC: RRR, no murmurs, rubs, gallops RESPIRATORY:  Clear to auscultation without rales, wheezing or rhonchi  ABDOMEN: Soft, non-tender, non-distended MUSCULOSKELETAL:  No edema; No deformity  SKIN: Warm and dry NEUROLOGIC:  Alert and oriented x 3 PSYCHIATRIC:  Normal affect   ASSESSMENT:    1. Chest pain of uncertain etiology   2. Chest pain, unspecified type   3. Precordial pain    PLAN:    In order of problems listed above:  1. Patient with episodes of chest pain radiating to his neck associated with diaphoresis.  Father had CAD/stent placement age 17.  Will evaluate patient with echocardiogram, for cardiac function, obtain coronary CTA to evaluate presence of CAD.  Follow-up after echo and coronary CTA.  Shared Decision Making/Informed Consent      Medication Adjustments/Labs and Tests Ordered: Current medicines are reviewed at length with the patient today.   Concerns regarding medicines are outlined above.  Orders Placed This Encounter  Procedures  . CT CORONARY MORPH W/CTA COR W/SCORE W/CA W/CM &/OR WO/CM  . CT CORONARY FRACTIONAL FLOW RESERVE DATA PREP  . CT CORONARY FRACTIONAL FLOW RESERVE FLUID ANALYSIS  . EKG 12-Lead  . ECHOCARDIOGRAM COMPLETE   Meds ordered this encounter  Medications  . metoprolol tartrate (LOPRESSOR) 100 MG tablet    Sig: Take 1 tablet (100 mg total) by mouth once for 1 dose. Take 2 hours prior to your CT scan.    Dispense:  1 tablet    Refill:  0  Patient Instructions  Medication Instructions:  Your physician recommends that you continue on your current medications as directed. Please refer to the Current Medication list given to you today.  *If you need a refill on your cardiac medications before your next appointment, please call your pharmacy*   Lab Work: None Ordered If you have labs (blood work) drawn today and your tests are completely normal, you will receive your results only by: Marland Kitchen MyChart Message (if you have MyChart) OR . A paper copy in the mail If you have any lab test that is abnormal or we need to change your treatment, we will call you to review the results.   Testing/Procedures:  1.  Your physician has requested that you have an echocardiogram. Echocardiography is a painless test that uses sound waves to create images of your heart. It provides your doctor with information about the size and shape of your heart and how well your heart's chambers and valves are working. This procedure takes approximately one hour. There are no restrictions for this procedure.   2.  Your physician has requested that you have cardiac CT. Cardiac computed tomography (CT) is a painless test that uses an x-ray machine to take clear, detailed pictures of your heart.  Your cardiac CT will be scheduled at:  [facility] [address] [place], [place] [address] [phone]   Please arrive 15 mins early for check-in and test prep.    Please follow these instructions carefully (unless otherwise directed):  Hold all erectile dysfunction medications at least 3 days (72 hrs) prior to test.  On the Night Before the Test: . Be sure to Drink plenty of water. . Do not consume any caffeinated/decaffeinated beverages or chocolate 12 hours prior to your test. . Do not take any antihistamines 12 hours prior to your test.   On the Day of the Test: . Drink plenty of water. Do not drink any water within one hour of the test. . Do not eat any food 4 hours prior to the test. . You may take your regular medications prior to the test.  . Take metoprolol (Lopressor) two hours prior to test.         After the Test: . Drink plenty of water. . After receiving IV contrast, you may experience a mild flushed feeling. This is normal. . On occasion, you may experience a mild rash up to 24 hours after the test. This is not dangerous. If this occurs, you can take Benadryl 25 mg and increase your fluid intake. . If you experience trouble breathing, this can be serious. If it is severe call 911 IMMEDIATELY. If it is mild, please call our office. . If you take any of these medications: Glipizide/Metformin, Avandament, Glucavance, please do not take 48 hours after completing test unless otherwise instructed.   Once we have confirmed authorization from your insurance company, we will call you to set up a date and time for your test. Based on how quickly your insurance processes prior authorizations requests, please allow up to 4 weeks to be contacted for scheduling your Cardiac CT appointment. Be advised that routine Cardiac CT appointments could be scheduled as many as 8 weeks after your provider has ordered it.  For non-scheduling related questions, please contact the cardiac imaging nurse navigator should you have any questions/concerns: Marchia Bond, Cardiac Imaging  Nurse Navigator Burley Saver, Interim Cardiac Imaging Nurse Navigator [place] [facility] Direct Office Dial:  [phone]   For scheduling needs, including cancellations and rescheduling, please call Vivien Rota at [phone], option 3.     Follow-Up: At [facility], you and your health needs are our priority.  As part of our continuing mission to provide you with exceptional heart care, we have created designated Provider Care Teams.  These Care Teams include your primary Cardiologist (physician) and Advanced Practice Providers (APPs -  Physician Assistants and Nurse Practitioners) who all work together to provide you with the care you need, when you need it.  We recommend signing up for the patient portal called "MyChart".  Sign up information is provided on this After Visit Summary.  MyChart is used to connect with patients for Virtual Visits (Telemedicine).  Patients are able to view lab/test results, encounter notes, upcoming appointments, etc.  Non-urgent messages can be sent to your provider as well.   To learn more about what you can do with MyChart, go to [email or web address].    Your next appointment:   Follow up after Echo and CTA   The format for your next appointment:   In Person  Provider:   Kate Sable, MD   Other Instructions      Signed, Kate Sable, MD  06/06/2020 12:56 PM    [place]

## 2020-06-06 NOTE — Patient Instructions (Signed)
Medication Instructions:  Your physician recommends that you continue on your current medications as directed. Please refer to the Current Medication list given to you today.  *If you need a refill on your cardiac medications before your next appointment, please call your pharmacy*   Lab Work: None Ordered If you have labs (blood work) drawn today and your tests are completely normal, you will receive your results only by: Marland Kitchen MyChart Message (if you have MyChart) OR . A paper copy in the mail If you have any lab test that is abnormal or we need to change your treatment, we will call you to review the results.   Testing/Procedures:  1.  Your physician has requested that you have an echocardiogram. Echocardiography is a painless test that uses sound waves to create images of your heart. It provides your doctor with information about the size and shape of your heart and how well your heart's chambers and valves are working. This procedure takes approximately one hour. There are no restrictions for this procedure.   2.  Your physician has requested that you have cardiac CT. Cardiac computed tomography (CT) is a painless test that uses an x-ray machine to take clear, detailed pictures of your heart.  Your cardiac CT will be scheduled at:  [facility] [address] [place], [place] [address] [phone]   Please arrive 15 mins early for check-in and test prep.    Please follow these instructions carefully (unless otherwise directed):  Hold all erectile dysfunction medications at least 3 days (72 hrs) prior to test.  On the Night Before the Test: . Be sure to Drink plenty of water. . Do not consume any caffeinated/decaffeinated beverages or chocolate 12 hours prior to your test. . Do not take any antihistamines 12 hours prior to your test.   On the Day of the Test: . Drink plenty of water. Do not drink any water within one hour of the  test. . Do not eat any food 4 hours prior to the test. . You may take your regular medications prior to the test.  . Take metoprolol (Lopressor) two hours prior to test.         After the Test: . Drink plenty of water. . After receiving IV contrast, you may experience a mild flushed feeling. This is normal. . On occasion, you may experience a mild rash up to 24 hours after the test. This is not dangerous. If this occurs, you can take Benadryl 25 mg and increase your fluid intake. . If you experience trouble breathing, this can be serious. If it is severe call 911 IMMEDIATELY. If it is mild, please call our office. . If you take any of these medications: Glipizide/Metformin, Avandament, Glucavance, please do not take 48 hours after completing test unless otherwise instructed.   Once we have confirmed authorization from your insurance company, we will call you to set up a date and time for your test. Based on how quickly your insurance processes prior authorizations requests, please allow up to 4 weeks to be contacted for scheduling your Cardiac CT appointment. Be advised that routine Cardiac CT appointments could be scheduled as many as 8 weeks after your provider has ordered it.  For non-scheduling related questions, please contact the cardiac imaging nurse navigator should you have any questions/concerns: Marchia Bond, Cardiac Imaging Nurse Navigator Burley Saver, Interim Cardiac Imaging Nurse [place] [facility] Direct Office Dial: [phone]   For  scheduling needs, including cancellations and rescheduling, please call Vivien Rota at [phone], option [phone].     Follow-Up: At [facility], you and your health needs are our priority.  As part of our continuing mission to provide you with exceptional heart care, we have created designated Provider Care Teams.  These Care Teams include your primary Cardiologist (physician) and Advanced Practice Providers (APPs -   Physician Assistants and Nurse Practitioners) who all work together to provide you with the care you need, when you need it.  We recommend signing up for the patient portal called "MyChart".  Sign up information is provided on this After Visit Summary.  MyChart is used to connect with patients for Virtual Visits (Telemedicine).  Patients are able to view lab/test results, encounter notes, upcoming appointments, etc.  Non-urgent messages can be sent to your provider as well.   To learn more about what you can do with MyChart, go to [email or web address].    Your next appointment:   Follow up after Echo and CTA   The format for your next appointment:   In Person  Provider:   Kate Sable, MD   Other Instructions

## 2020-06-10 NOTE — Telephone Encounter (Signed)
LMTCB to discuss lab results.

## 2020-06-11 NOTE — Telephone Encounter (Signed)
Patient aware of results and has already seen Cardio. Patient will see cardio again for follow up on 06/20/20

## 2020-06-20 ENCOUNTER — Ambulatory Visit (INDEPENDENT_AMBULATORY_CARE_PROVIDER_SITE_OTHER): Payer: BC Managed Care – PPO

## 2020-06-20 ENCOUNTER — Other Ambulatory Visit: Payer: Self-pay

## 2020-06-20 DIAGNOSIS — R079 Chest pain, unspecified: Secondary | ICD-10-CM

## 2020-06-20 LAB — ECHOCARDIOGRAM COMPLETE
AR max vel: 3.95 cm2
AV Area VTI: 4.04 cm2
AV Area mean vel: 3.92 cm2
AV Mean grad: 2 mmHg
AV Peak grad: 2.9 mmHg
Ao pk vel: 0.85 m/s
Area-P 1/2: 4.99 cm2
Calc EF: 53.2 %
S' Lateral: 2.6 cm
Single Plane A2C EF: 51.2 %
Single Plane A4C EF: 55.7 %

## 2020-06-25 ENCOUNTER — Telehealth: Payer: Self-pay

## 2020-06-25 NOTE — Telephone Encounter (Signed)
Left a VM for patient to call back so I can give him echo results. I will also need to reschedule his follow up appointment as his [place] CTA has been scheduled after the follow up.

## 2020-07-03 ENCOUNTER — Telehealth: Payer: Self-pay

## 2020-07-03 NOTE — Telephone Encounter (Addendum)
Spoke with patient and gave him his Echo results. His CTA was not scheduled until 08/15/19 and he has a follow up with Dr. Azucena Cecil scheduled for 12/17. When I recommended that we reschedule that for after the CTA, he stated he was not sure he wanted to have the CTA due to cost and would like to see Dr. Azucena Cecil to discuss this first.  Left appointment as is.  ----- Message from Debbe Odea, MD sent at 06/20/2020  5:20 PM EST -----  mild MR, otherwise normal echo. No findings to suggest etiology of chestpain

## 2020-07-18 ENCOUNTER — Ambulatory Visit: Payer: BC Managed Care – PPO | Admitting: Cardiology

## 2020-07-21 ENCOUNTER — Encounter: Payer: Self-pay | Admitting: Cardiology

## 2020-08-13 ENCOUNTER — Telehealth (HOSPITAL_COMMUNITY): Payer: Self-pay | Admitting: Emergency Medicine

## 2020-08-13 NOTE — Telephone Encounter (Signed)
Attempted to call patient regarding upcoming cardiac CT appointment. °Left message on voicemail with name and callback number °Annica Marinello RN Navigator Cardiac Imaging °[place] [facility] °[phone] Office °[phone] Cell ° °

## 2020-08-13 NOTE — Telephone Encounter (Signed)
Pt called and left VM on my answering machine reporting that hes had a covid exposure and needs to cancel CCTA for tomorrow.   I called him back and left him a VM confiming receipt of his message and left him my number and the CT schedulers number to r/s in the future.  [phone]  Rockwell Alexandria RN Navigator Cardiac Imaging [facility] [phone] Office  [phone] Cell

## 2020-08-14 ENCOUNTER — Ambulatory Visit: Admission: RE | Admit: 2020-08-14 | Payer: BC Managed Care – PPO | Source: Ambulatory Visit

## 2020-08-19 ENCOUNTER — Telehealth (HOSPITAL_COMMUNITY): Payer: Self-pay | Admitting: *Deleted

## 2020-08-19 NOTE — Telephone Encounter (Signed)
Attempted to call patient regarding upcoming cardiac CT appointment. Left message on voicemail with name and callback number  Natisha Trzcinski RN Navigator Cardiac Imaging [place] [facility] [phone] Office [phone] Cell  

## 2020-08-21 ENCOUNTER — Other Ambulatory Visit: Payer: Self-pay

## 2020-08-21 ENCOUNTER — Ambulatory Visit
Admission: RE | Admit: 2020-08-21 | Discharge: 2020-08-21 | Disposition: A | Discharge status: Home / Self Care | Payer: BC Managed Care – PPO | Source: Ambulatory Visit | Attending: Cardiology | Admitting: Cardiology

## 2020-08-21 DIAGNOSIS — R079 Chest pain, unspecified: Secondary | ICD-10-CM | POA: Diagnosis not present

## 2020-08-21 MED ORDER — IOHEXOL 350 MG/ML SOLN: 100.0000 mL | Freq: Once | INTRAVENOUS | Status: DC | PRN

## 2020-08-21 MED ORDER — IOHEXOL 350 MG/ML SOLN
85.0000 mL | Freq: Once | INTRAVENOUS | Status: AC | PRN
  Administered 2020-08-21: 85 mL via INTRAVENOUS

## 2020-08-21 MED ORDER — METOPROLOL TARTRATE 100 MG PO TABS
100.0000 mg | ORAL_TABLET | Freq: Once | ORAL | Status: AC
  Administered 2020-08-21: 100 mg via ORAL

## 2020-08-21 MED ORDER — NITROGLYCERIN 0.4 MG SL SUBL
0.8000 mg | SUBLINGUAL_TABLET | Freq: Once | SUBLINGUAL | Status: AC
  Administered 2020-08-21: 0.8 mg via SUBLINGUAL

## 2020-08-21 NOTE — Progress Notes (Signed)
Patient tolerated CT well. Drank water after. Vital signs stable encourage to drink water throughout day.Reasons explained and verbalized understanding. Ambulated steady gait.  

## 2021-07-07 IMAGING — CT CT ANGIO CHEST
2 of 7 series · 13 of 36 positions shown · IV contrast (omnipaque)
Comparison: Chest radiographs 06/19/2019.

CLINICAL DATA: 49-year-old male with intermittent episodes of
severe chest and mediastinal pain over the past 2 weeks. Shortness
of breath. Remote right clavicle fracture and surgery.

EXAM:
CT ANGIOGRAPHY CHEST WITH CONTRAST
TECHNIQUE: Multidetector CT imaging of the chest was performed using the
standard protocol during bolus administration of intravenous
contrast. Multiplanar CT image reconstructions and MIPs were
obtained to evaluate the vascular anatomy.
CONTRAST:  75mL OMNIPAQUE IOHEXOL 350 MG/ML SOLN

[Series 6: thins cta pulmonary 1.00 · axial · 0.66mm/px · z∈[-1207,-944]mm · 12 of 311 slices shown]
[im 24/311  lung]
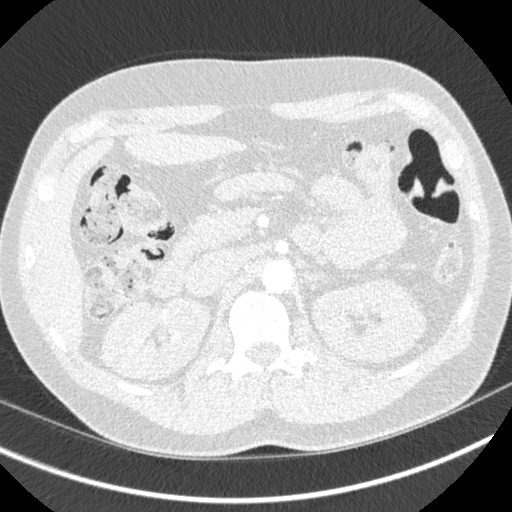
[im 48/311  mediastinal]
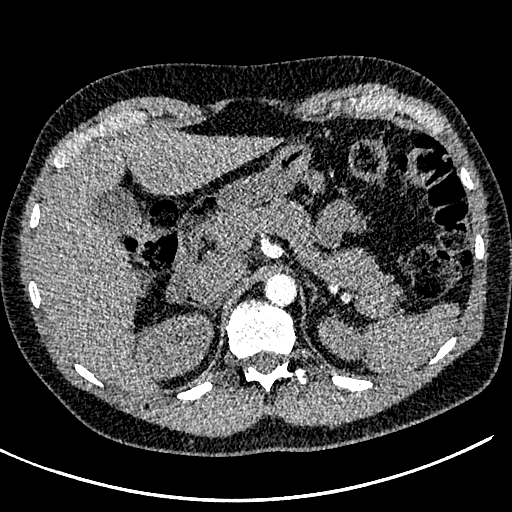
[im 72/311  lung]
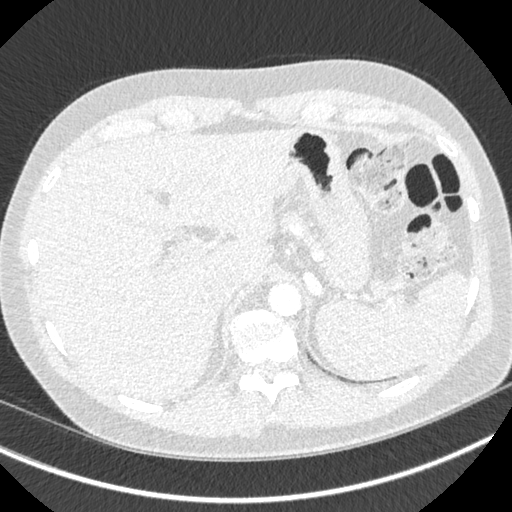
[im 96/311  mediastinal]
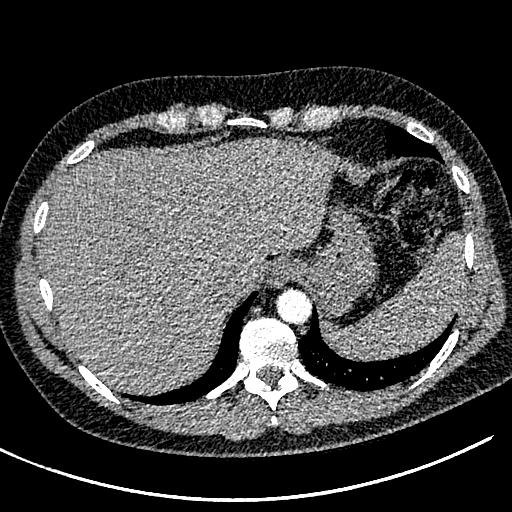
[im 120/311  lung]
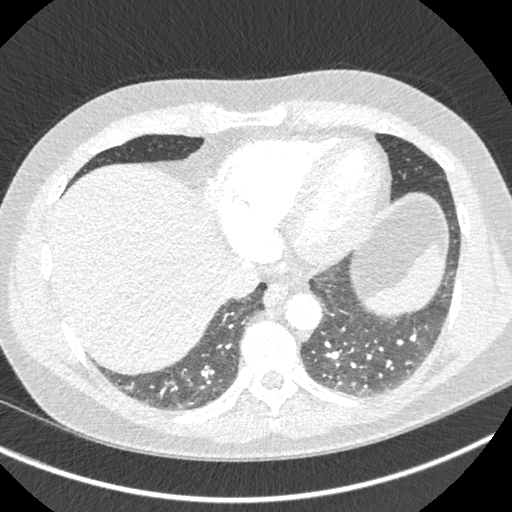
[im 144/311  mediastinal]
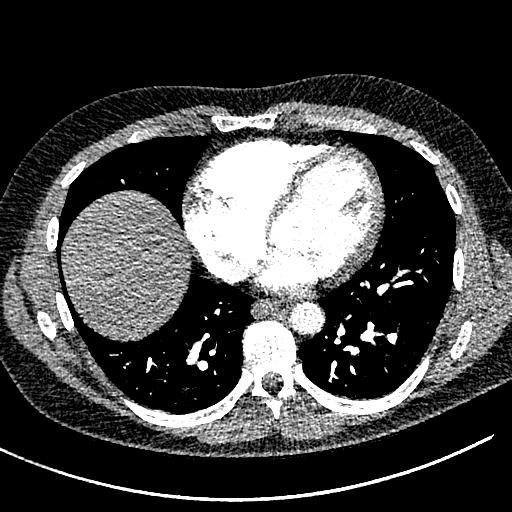
[im 167/311  lung]
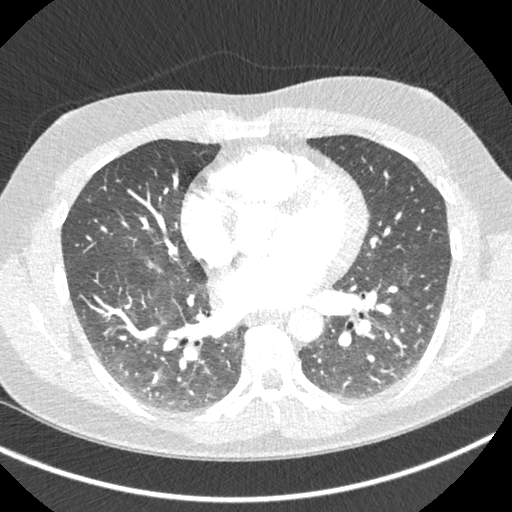
[im 191/311  mediastinal]
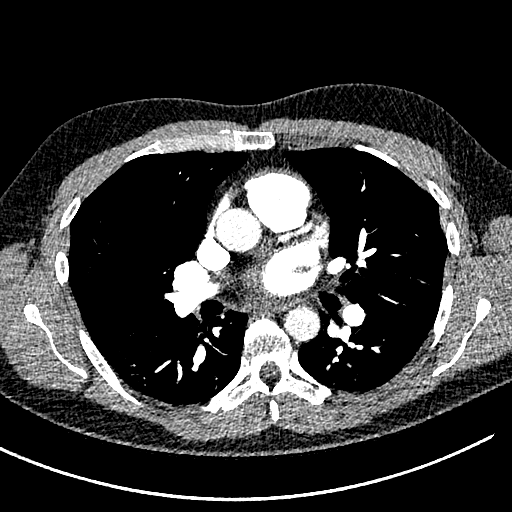
[im 215/311  lung]
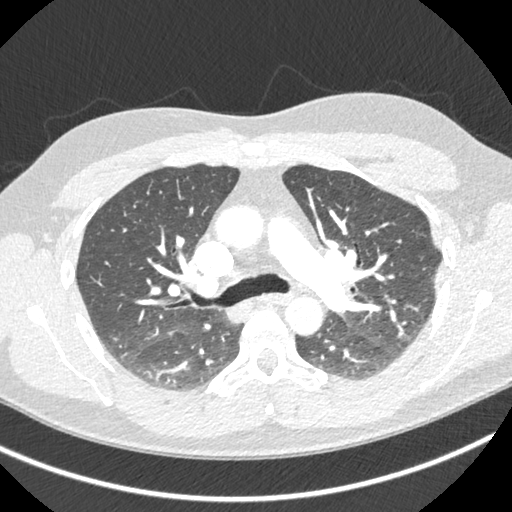
[im 239/311  mediastinal]
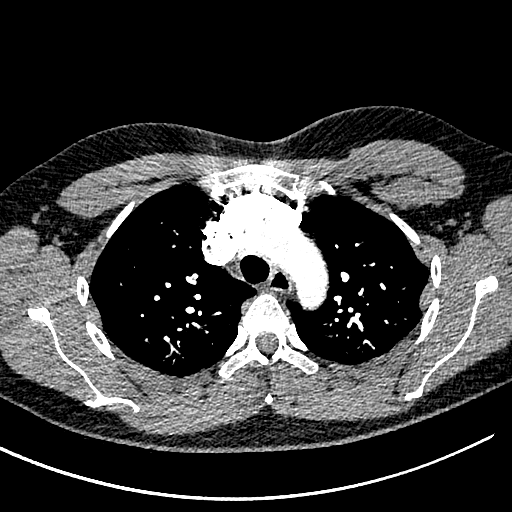
[im 263/311  lung]
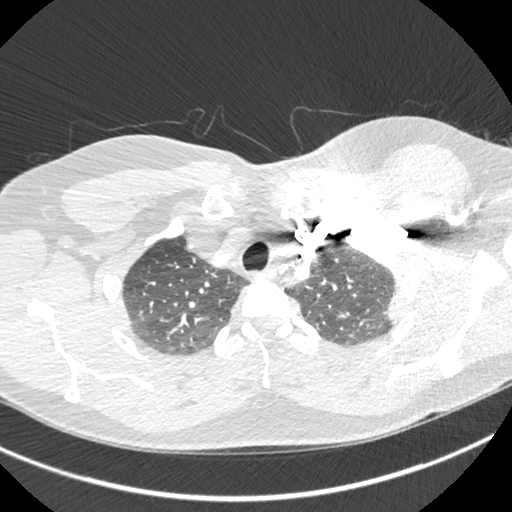
[im 287/311  mediastinal]
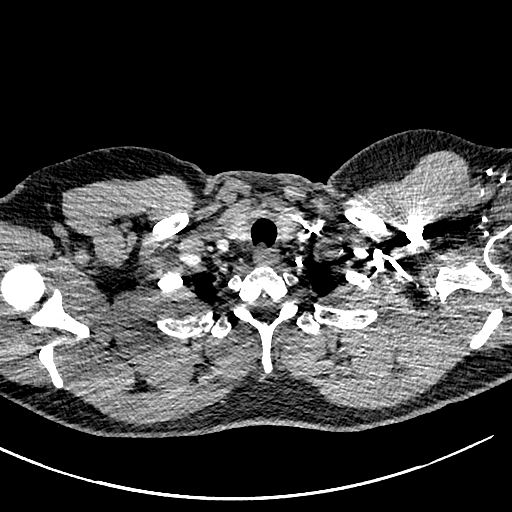

[Series 8: cta pulmonary 2.00 cor · coronal · 0.61mm/px · 1 of 130 slices shown]
[im 65/130  mediastinal]
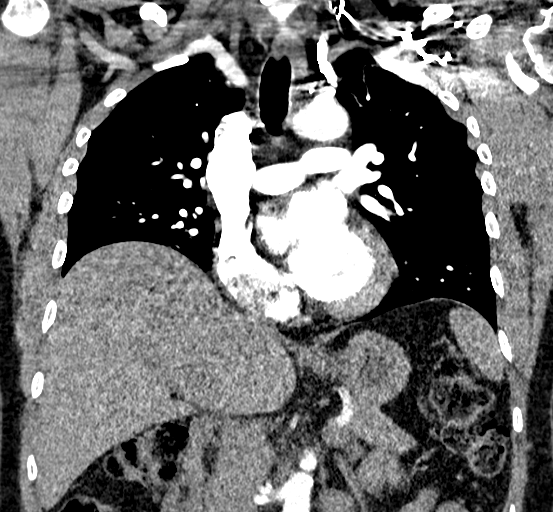

[13 of 36 positions shown; findings below may reference images not displayed]

FINDINGS: Cardiovascular: Excellent contrast bolus timing in the pulmonary
arterial tree.

No focal filling defect in the pulmonary arteries to suggest acute
pulmonary embolism.

No cardiomegaly or pericardial effusion. Negative visible aorta. No
calcified coronary artery atherosclerosis is evident.

Mediastinum/Nodes: Negative. No mediastinal or hilar
lymphadenopathy.

Lungs/Pleura: Major airways are patent. Somewhat low lung volumes,
which might explain the appearance of bilateral basilar predominant
mild and patchy pulmonary ground-glass opacity. No confluent or
geographic areas of ground-glass. No other pulmonary interstitial
changes. No pleural effusion or consolidation.

Upper Abdomen: Negative visible liver, gallbladder, spleen,
pancreas, adrenal glands, kidneys, and bowel in the upper abdomen.

Musculoskeletal: Mild irregularity of the visible distal right
clavicle compatible with remote trauma. Intermittent thoracic spine
disc and endplate degeneration. Hypoplastic ribs at T12. No acute
osseous abnormality identified.

Review of the MIP images confirms the above findings.
IMPRESSION: 1. Negative for pulmonary embolus.

2. Bilateral patchy, mostly dependent pulmonary ground-glass opacity
is favored due to atelectasis. The appearance is not convincing for
a viral/atypical respiratory infection, and no other pulmonary
interstitial changes are identified.

## 2021-11-11 ENCOUNTER — Ambulatory Visit (INDEPENDENT_AMBULATORY_CARE_PROVIDER_SITE_OTHER): Payer: BC Managed Care – PPO | Admitting: Family Medicine

## 2021-11-11 ENCOUNTER — Ambulatory Visit (INDEPENDENT_AMBULATORY_CARE_PROVIDER_SITE_OTHER): Payer: BC Managed Care – PPO

## 2021-11-11 ENCOUNTER — Encounter: Payer: Self-pay | Admitting: Family Medicine

## 2021-11-11 ENCOUNTER — Other Ambulatory Visit: Payer: BC Managed Care – PPO

## 2021-11-11 VITALS — BP 100/70 | HR 75 | Temp 98.5°F | Ht 67.0 in | Wt 184.6 lb

## 2021-11-11 DIAGNOSIS — M79672 Pain in left foot: Secondary | ICD-10-CM

## 2021-11-11 DIAGNOSIS — Z1211 Encounter for screening for malignant neoplasm of colon: Secondary | ICD-10-CM | POA: Diagnosis not present

## 2021-11-11 MED ORDER — MELOXICAM 15 MG PO TABS: 15.0000 mg | ORAL_TABLET | Freq: Every day | ORAL | 0 refills | Status: AC | PRN

## 2021-11-11 NOTE — Patient Instructions (Signed)
Nice to see you. ?We will get an x-ray later today. ?Please try the meloxicam for your foot pain.  You can take this once daily for 7 days and then once daily as needed after that.  Please take this with food.  If it irritates your stomach please let us know. ?Sports medicine should call you to schedule an appointment. ?GI should contact you to set up a colonoscopy. ?Please get me a copy of your lab work. ?

## 2021-11-11 NOTE — Progress Notes (Signed)
?  Marikay Alar, MD ?Phone: [phone] ? ?Warren Harrison is a 51 y.o. male who presents today for same-day visit. ? ?Left foot pain: Patient notes this has been bothering him since around Thanksgiving of 2022.  Has seemed to worsen over the last several months.  He notes it feels like there is a pebble embedded in his foot around the second metatarsal head.  No numbness or burning in his toes.  He has replaced his shoes and switch to shoes with a larger toe box.  He cut back on his mileage of hiking.  He tried insoles and heat moldable insoles.  He notes for the most part it hurts when he is on it.  He has been taking a combination of Advil and Tylenol intermittently with some benefit.  None of the changes he is made have been terribly beneficial.  He notes a year or so ago he started hiking and worked his way up to a higher mileage of 7 to 9 miles at a time. ? ?Social History  ? ?Tobacco Use  ?Smoking Status Never  ?Smokeless Tobacco Never  ? ? ?Current Outpatient Medications on File Prior to Visit  ?Medication Sig Dispense Refill  ? metoprolol tartrate (LOPRESSOR) 100 MG tablet Take 1 tablet (100 mg total) by mouth once for 1 dose. Take 2 hours prior to your CT scan. 1 tablet 0  ? ?No current facility-administered medications on file prior to visit.  ? ? ? ?ROS see history of present illness ? ?Objective ? ?Physical Exam ?Vitals:  ? 11/11/21 1215  ?BP: 100/70  ?Pulse: 75  ?Temp: 98.5 ?F (36.9 ?C)  ?SpO2: 96%  ? ? ?BP Readings from Last 3 Encounters:  ?11/11/21 100/70  ?08/21/20 112/68  ?06/06/20 112/78  ? ?Wt Readings from Last 3 Encounters:  ?11/11/21 184 lb 9.6 oz (83.7 kg)  ?06/06/20 208 lb (94.3 kg)  ?06/04/20 206 lb (93.4 kg)  ? ? ?Physical Exam ?Musculoskeletal:  ?     Feet: ? ?Feet:  ?   Comments: Right foot with no swelling, erythema, or tenderness ? ? ? ?Assessment/Plan: Please see individual problem list. ? ?Problem List Items Addressed This Visit   ? ? Left foot pain - Primary  ?  Certainly this  could be a stress fracture or a joint issue or a synovitis.  Discussed trial of prescription strength anti-inflammatory.  We will get an x-ray to evaluate for underlying fracture.  He will be referred to sports medicine for further evaluation. ?  ?  ? Relevant Medications  ? meloxicam (MOBIC) 15 MG tablet  ? Other Relevant Orders  ? DG Foot Complete Left  ? Ambulatory referral to Sports Medicine  ? ?Other Visit Diagnoses   ? ? Colon cancer screening      ? Relevant Orders  ? Ambulatory referral to Gastroenterology  ? ?  ? ? ? ?Health Maintenance: GI referral for colonoscopy.  Patient reports he had labs less than a year ago through work.  He will get me a copy of those. ? ?Return if symptoms worsen or fail to improve, for Follow-up once yearly for physical. ? ?This visit occurred during the SARS-CoV-2 public health emergency.  Safety protocols were in place, including screening questions prior to the visit, additional usage of staff PPE, and extensive cleaning of exam room while observing appropriate contact time as indicated for disinfecting solutions.  ? ? ?Marikay Alar, MD ?[facility] [place] ? ?

## 2021-11-11 NOTE — Assessment & Plan Note (Signed)
Certainly this could be a stress fracture or a joint issue or a synovitis.  Discussed trial of prescription strength anti-inflammatory.  We will get an x-ray to evaluate for underlying fracture.  He will be referred to sports medicine for further evaluation. ?

## 2021-11-13 ENCOUNTER — Telehealth: Payer: Self-pay

## 2021-11-13 NOTE — Telephone Encounter (Signed)
CALLED PATIENT NO ANSWER LEFT VOICEMAIL FOR A CALL BACK ? ?

## 2021-11-16 ENCOUNTER — Telehealth: Payer: Self-pay | Admitting: Family Medicine

## 2021-11-16 NOTE — Telephone Encounter (Signed)
Pt called requesting x-ray results. 

## 2021-11-16 NOTE — Telephone Encounter (Signed)
I called and spoke with the patient and gave him his xray results. Warren Harrison,cma  ?

## 2021-11-17 ENCOUNTER — Telehealth: Payer: Self-pay

## 2021-11-17 NOTE — Telephone Encounter (Signed)
CALLED PATIENT NO ANSWER LEFT VOICEMAIL FOR A CALL BACK °Letter sent °

## 2021-11-19 NOTE — Progress Notes (Signed)
? ? Warren Harrison ?[facility] ?[place] ?Phone: [phone] ?  ?Assessment and Plan:   ?  ?1. Left foot pain ?2. Pes planus of both feet ?-Chronic with exacerbation, initial sports medicine visit ?- Unclear etiology of left foot pain concentrated along metatarsal arch and first and second met.  Original pain was after a long, rocky hike, which could have introduced a bone bruise or stress to metatarsal arch.  Several weeks after, patient stopped using his arch supports and metatarsal pads, thinking that this would improve his symptoms, though symptoms have not resolved.  Has used intermittent NSAID use with only temporary relief ?- Current treatment plan includes restarting use of arch support with metatarsal pad based on patient's foot shape, using wide toe box shoes, course of daily NSAIDs, HEP ?- Start HEP for intrinsic foot exercises ?- Start meloxicam 15 mg daily x2 weeks.  If still having pain after 2 weeks, complete 3rd-week of meloxicam. May use remaining meloxicam as needed once daily for pain control.  Do not to use additional NSAIDs while taking meloxicam.  May use Tylenol [phone] mg 2 to 3 times a day for breakthrough pain. ?-Ultrasound obtained in clinic.  Interpretation below ? ?Sports Medicine: Musculoskeletal Ultrasound. ?Exam: Limited US of left foot ?Diagnosis: Left foot pain ? ?US Findings: Unremarkable appearance of first and second metatarsal and long axis.  No significant edema or abnormality seen between first and second metatarsal where patient's tenderness was most significantly localized.  No abnormality seen between metatarsals 2-5 ? ?US Impression:  ?Unremarkable ?  ?Pertinent previous records reviewed include foot x-ray 11/11/2021, family medicine note 11/11/21 ?  ?Follow Up: 4 weeks for reevaluation.  Could consider PT versus prescription insoles.  Patient is not interested in CSI, and I agree with him at this time since I  do not see significant finding on x-ray or ultrasound ?  ?Subjective:   ?I, Pincus Badder, am serving as a Education administrator for [employer] ? ?Chief Complaint: left foot pain  ? ?HPI:  ?11/20/2021 ?Patient is a 51 year old male complaining of left foot pain. Patient states this has been bothering him since around Thanksgiving of 2022 thinks he might of stepped on a rock really hard .  Has seemed to worsen over the last several months.  He notes it feels like there is a pebble embedded in his foot around the second metatarsal head.  No numbness or burning in his toes.  He has replaced his shoes and switch to shoes with a larger toe box .  He cut back on his mileage of hiking.  He tried insoles and heat moldable insoles.  He notes for the most part it hurts when he is on it. He has been taking a combination of Advil and Tylenol intermittently with some benefit takes the edge off no numbness or tingling .  None of the changes he is made have been terribly beneficial.  He notes a year or so ago he started hiking and worked his way up to a higher mileage of 7 to 9 miles at a time. hurts the most when he flexes or hyper flexes his toes  ? ?Relevant Historical Information: None pertinent ? ?Additional pertinent review of systems negative. ? ? ?Current Outpatient Medications:  ?  meloxicam (MOBIC) 15 MG tablet, Take 1 tablet (15 mg total) by mouth daily as needed for pain., Disp: 15 tablet, Rfl: 0 ?  meloxicam (MOBIC) 15  MG tablet, Take 1 tablet (15 mg total) by mouth daily., Disp: 30 tablet, Rfl: 0 ?  metoprolol tartrate (LOPRESSOR) 100 MG tablet, Take 1 tablet (100 mg total) by mouth once for 1 dose. Take 2 hours prior to your CT scan., Disp: 1 tablet, Rfl: 0  ? ?Objective:   ?  ?Vitals:  ? 11/20/21 0819  ?BP: 106/80  ?Pulse: 65  ?SpO2: 97%  ?Weight: 188 lb (85.3 kg)  ?Height: 5' 7"  (1.702 m)  ?  ?  ?Body mass index is 29.44 kg/m?.  ?  ?Physical Exam:   ? ?Gen: Appears well, nad, nontoxic and pleasant ?Psych: Alert and  oriented, appropriate mood and affect ?Neuro: sensation intact, strength is 5/5 with df/pf/inv/ev, muscle tone wnl ?Skin: no susupicious lesions or rashes ? ?Left foot/ankle:  no swelling or effusion ?Bilateral pes planus with overpronation, valgus angle of ankles from posterior view with weightbearing ?TTP dorsum between first and second metatarsal heads, mildly between 2-3/3-4 metatarsal heads ?NTTP over fibular head, lat mal, medial mal, achilles, navicular, base of 5th, ATFL, CFL, deltoid, calcaneous or midfoot ?ROM DF 30, PF 45, inv/ev intact ?Negative ant drawer, talar tilt, rotation test, tib-fib squeeze test. ?Mildly positive metatarsal head squeeze test ?Neg thompson ?No pain with resisted inversion or eversion  ? ? ?Electronically signed by:  ?Warren Harrison ?[facility] ?9:00 AM 11/20/21 ?

## 2021-11-20 ENCOUNTER — Ambulatory Visit (INDEPENDENT_AMBULATORY_CARE_PROVIDER_SITE_OTHER): Payer: BC Managed Care – PPO | Admitting: Sports Medicine

## 2021-11-20 ENCOUNTER — Ambulatory Visit: Payer: Self-pay

## 2021-11-20 VITALS — BP 106/80 | HR 65 | Ht 67.0 in | Wt 188.0 lb

## 2021-11-20 DIAGNOSIS — M2141 Flat foot [pes planus] (acquired), right foot: Secondary | ICD-10-CM | POA: Diagnosis not present

## 2021-11-20 DIAGNOSIS — M2142 Flat foot [pes planus] (acquired), left foot: Secondary | ICD-10-CM

## 2021-11-20 DIAGNOSIS — M79672 Pain in left foot: Secondary | ICD-10-CM

## 2021-11-20 MED ORDER — MELOXICAM 15 MG PO TABS: 15.0000 mg | ORAL_TABLET | Freq: Every day | ORAL | 0 refills | Status: AC

## 2021-11-20 NOTE — Patient Instructions (Addendum)
Good to see you  ?Toe HEP ?Start meloxicam 15 mg daily x2 weeks.  If still having pain after 2 weeks, complete 3rd-week of meloxicam. May use remaining meloxicam as needed once daily for pain control.  Do not to use additional NSAIDs while taking meloxicam.  May use Tylenol [phone] mg 2 to 3 times a day for breakthrough pain. ?Pick up Dr . Jean Rosenthal Prescription no the other physicians ?Rest for 1 week  ?Can gradually re-introduce activity  ?Recommend using foot wear with wide toe box, arch support , metatarsal pad  ?4 week follow up   ? ?

## 2022-01-11 ENCOUNTER — Ambulatory Visit (INDEPENDENT_AMBULATORY_CARE_PROVIDER_SITE_OTHER): Payer: BC Managed Care – PPO | Admitting: Sports Medicine

## 2022-01-11 VITALS — BP 118/82 | HR 95 | Ht 67.0 in | Wt 192.0 lb

## 2022-01-11 DIAGNOSIS — M2141 Flat foot [pes planus] (acquired), right foot: Secondary | ICD-10-CM

## 2022-01-11 DIAGNOSIS — M2142 Flat foot [pes planus] (acquired), left foot: Secondary | ICD-10-CM | POA: Diagnosis not present

## 2022-01-11 DIAGNOSIS — M79672 Pain in left foot: Secondary | ICD-10-CM | POA: Diagnosis not present

## 2022-01-11 NOTE — Progress Notes (Signed)
Warren Harrison [facility] [address] [place] [address] Phone: [phone]   Assessment and Plan:     1. Left foot pain 2. Pes planus of both feet -Chronic with exacerbation, subsequent visit - Significant improvement in pain left arch after completion of 2-week course of meloxicam 15 mg daily, changing footwear and using metatarsal pad for support - Discontinue meloxicam daily and may use remainder as needed - Continue using comfortable footwear with metatarsal inserts - May use Voltaren gel topically as needed   Pertinent previous records reviewed include none   Follow Up: As needed if no improvement or worsening of symptoms   Subjective:   I, Warren Harrison, am serving as a Neurosurgeon for Doctor [employer]   Chief Complaint: left foot pain    HPI:  11/20/2021 Patient is a 51 year old male complaining of left foot pain. Patient states this has been bothering him since around Thanksgiving of 2022 thinks he might of stepped on a rock really hard .  Has seemed to worsen over the last several months.  He notes it feels like there is a pebble embedded in his foot around the second metatarsal head.  No numbness or burning in his toes.  He has replaced his shoes and switch to shoes with a larger toe box .  He cut back on his mileage of hiking.  He tried insoles and heat moldable insoles.  He notes for the most part it hurts when he is on it. He has been taking a combination of Advil and Tylenol intermittently with some benefit takes the edge off no numbness or tingling .  None of the changes he is made have been terribly beneficial.  He notes a year or so ago he started hiking and worked his way up to a higher mileage of 7 to 9 miles at a time. hurts the most when he flexes or hyper flexes his toes   01/11/2022 Patient states that he is a bit better , is able to walk more with almost no pain, was able to mountain bike as well, has some morning  pain but once he gets moving the pain is better   Relevant Historical Information: none pertinent   Additional pertinent review of systems negative.   Current Outpatient Medications:    meloxicam (MOBIC) 15 MG tablet, Take 1 tablet (15 mg total) by mouth daily as needed for pain., Disp: 15 tablet, Rfl: 0   meloxicam (MOBIC) 15 MG tablet, Take 1 tablet (15 mg total) by mouth daily., Disp: 30 tablet, Rfl: 0   metoprolol tartrate (LOPRESSOR) 100 MG tablet, Take 1 tablet (100 mg total) by mouth once for 1 dose. Take 2 hours prior to your CT scan., Disp: 1 tablet, Rfl: 0   Objective:     Vitals:   01/11/22 0829  BP: 118/82  Pulse: 95  SpO2: 97%  Weight: 192 lb (87.1 kg)  Height: 5\' 7"  (1.702 m)      Body mass index is 30.07 kg/m.    Physical Exam:    Gen: Appears well, nad, nontoxic and pleasant Psych: Alert and oriented, appropriate mood and affect Neuro: sensation intact, strength is 5/5 with df/pf/inv/ev, muscle tone wnl Skin: no susupicious lesions or rashes   Left foot/ankle:  no swelling or effusion Bilateral pes planus with overpronation, valgus angle of ankles from posterior view with weightbearing NTTP over fibular head, lat mal, medial mal, achilles, navicular, base of 5th, ATFL,  CFL, deltoid, calcaneous or midfoot ROM DF 30, PF 45, inv/ev intact Negative ant drawer, talar tilt, rotation test, tib-fib squeeze test. Neg thompson No pain with resisted inversion or eversion    Electronically signed by:  Warren Harrison [facility] 8:43 AM 01/11/22

## 2022-01-11 NOTE — Patient Instructions (Addendum)
Good to see you Voltaren gel topically as needed  As needed follow up

## 2022-12-13 IMAGING — DX DG FOOT COMPLETE 3+V*L*
3 series · 3 of 3 positions shown · non-contrast
Comparison: None.

CLINICAL DATA: Left foot pain near the head of the second
metatarsal ongoing for least 3 months.

EXAM:
LEFT FOOT - COMPLETE 3+ VIEW

[foot ap]
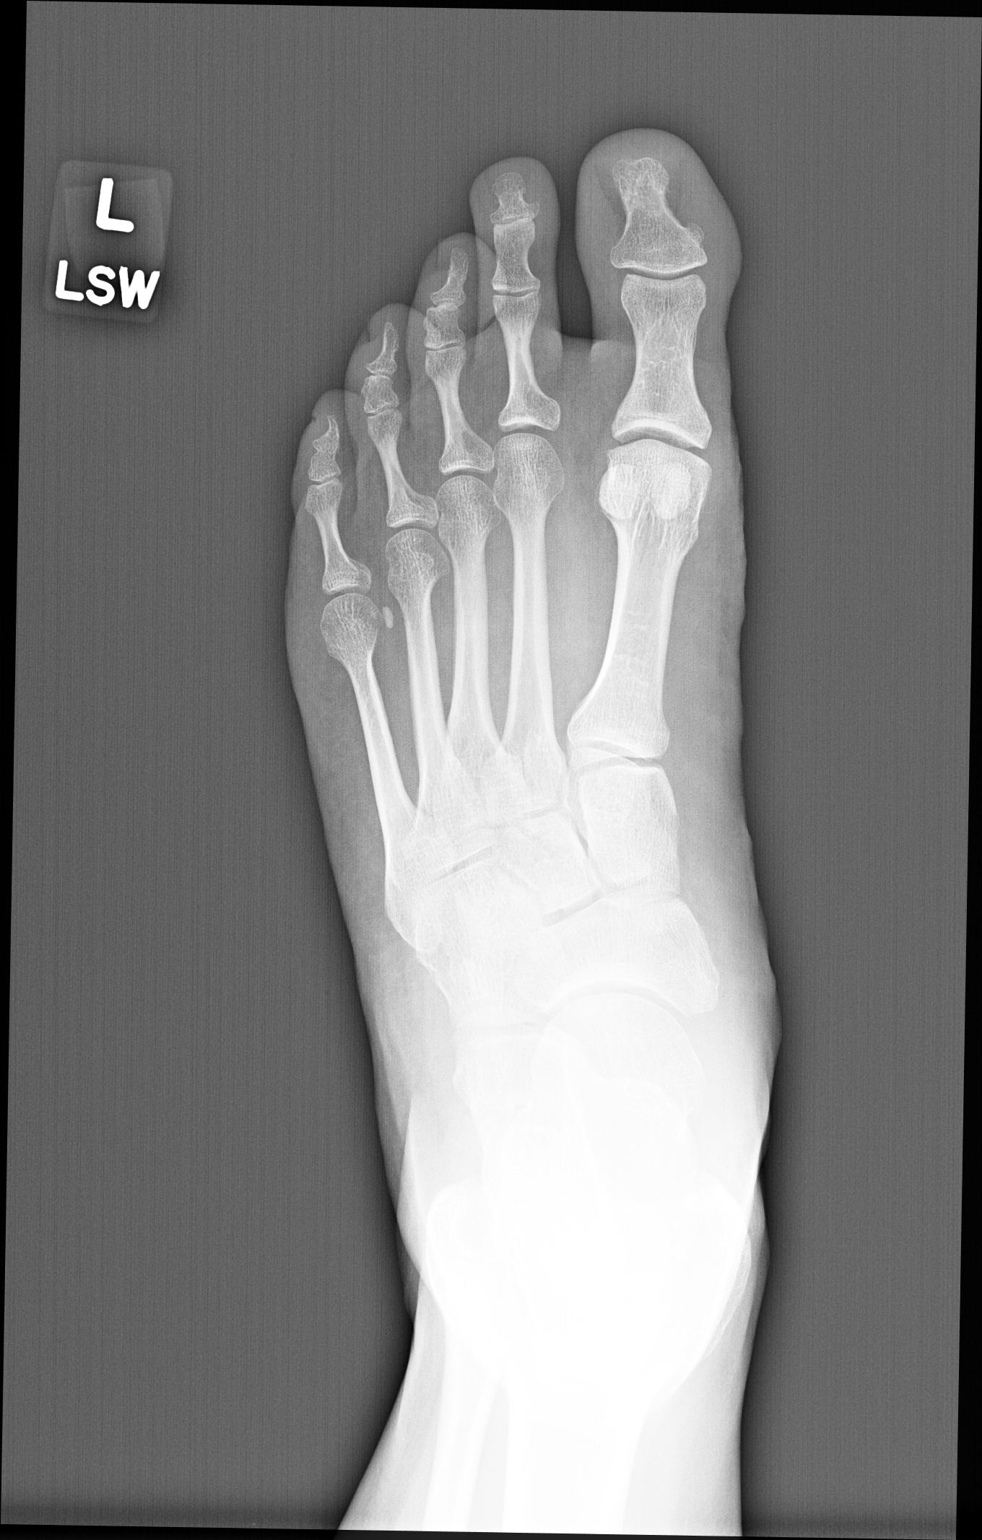

[foot obl (oblique)]
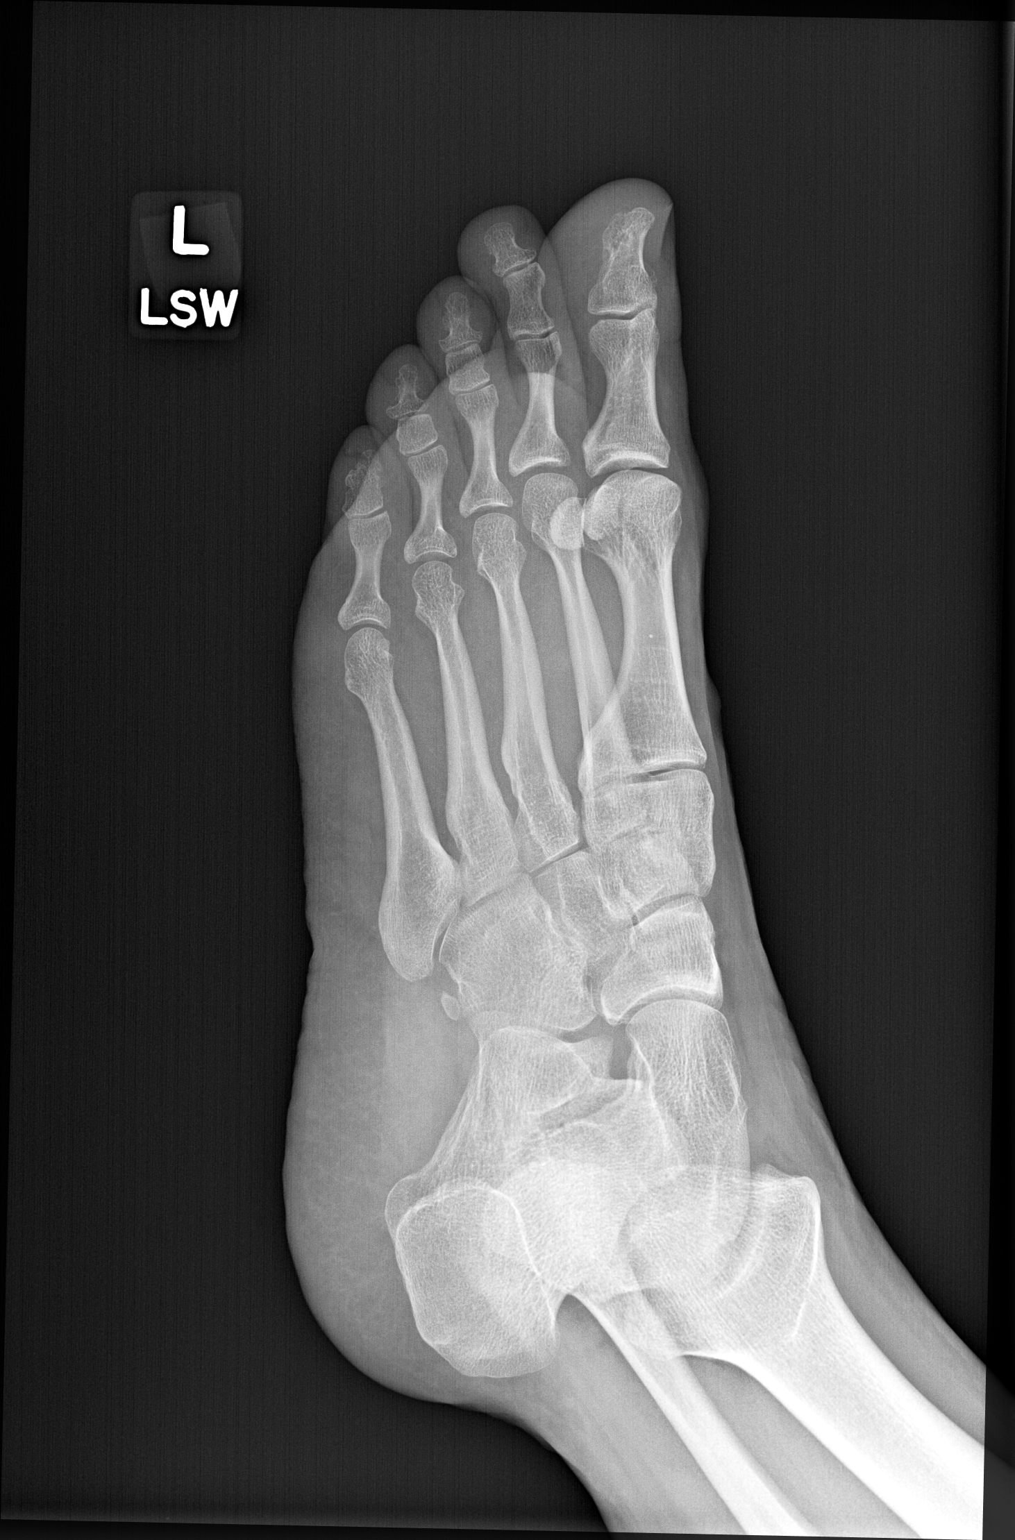

[foot lat]
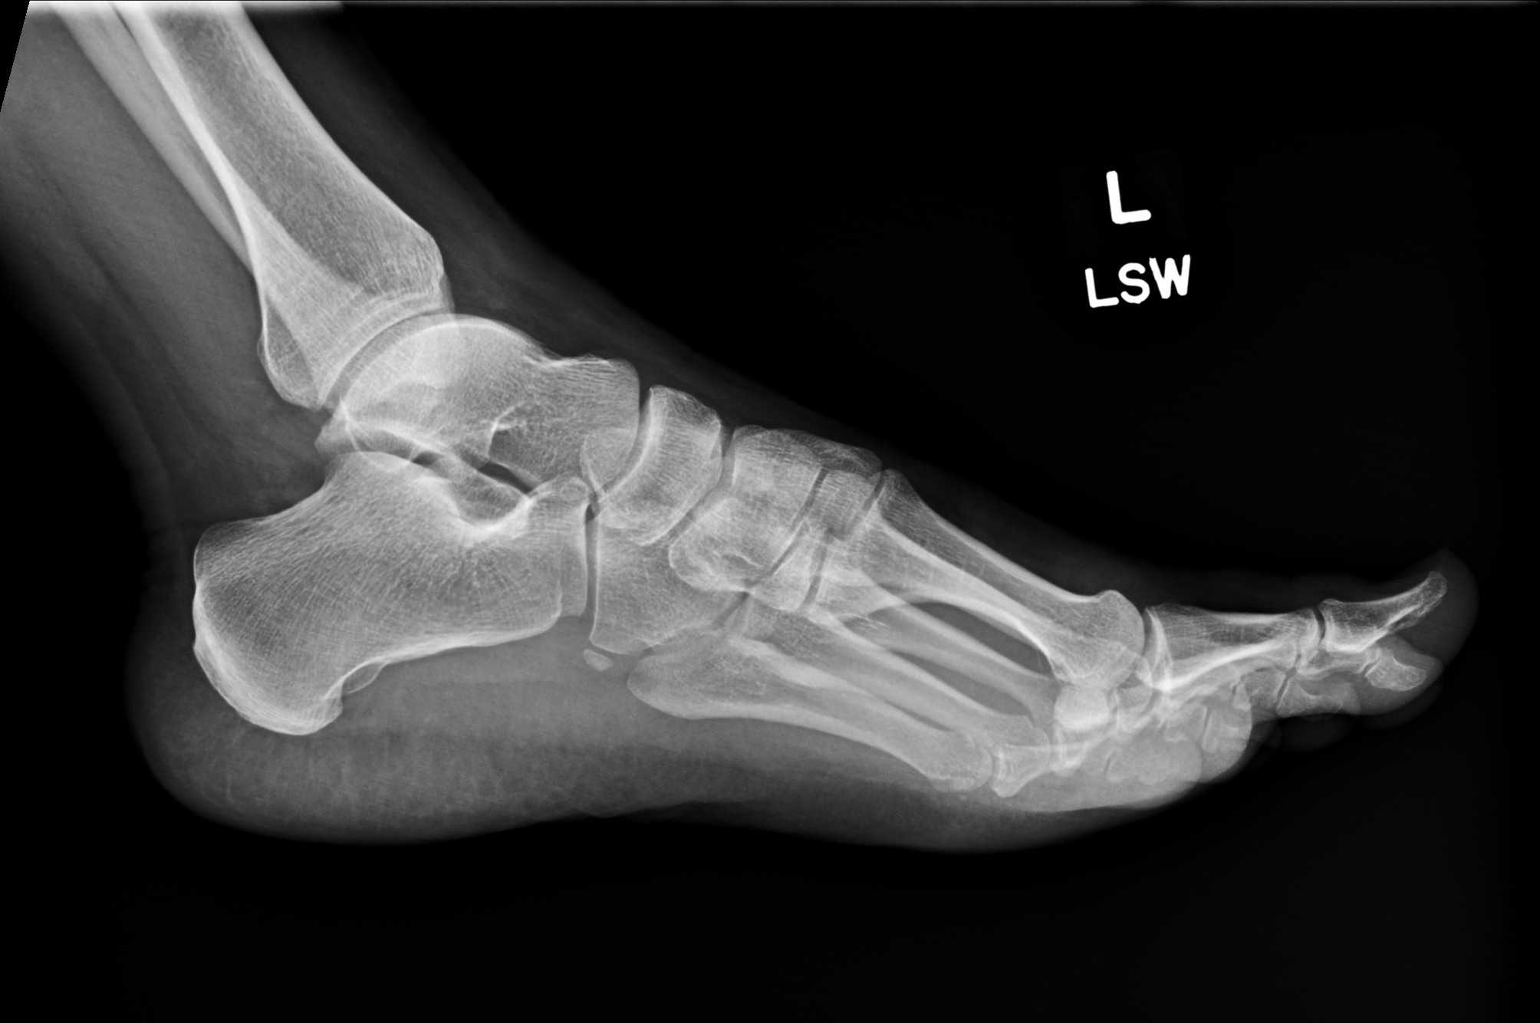

[3 of 3 positions shown; findings below may reference images not displayed]

FINDINGS: Normal bone mineralization. Joint spaces are preserved. No acute
fracture or dislocation. Normal appearance of the second metatarsal
head.
IMPRESSION: Normal left foot radiographs.

## 2023-03-25 ENCOUNTER — Telehealth: Payer: Self-pay | Admitting: Family Medicine

## 2023-03-25 NOTE — Telephone Encounter (Signed)
 Left voicemail for patient to schedule annual physical

## 2023-10-25 ENCOUNTER — Telehealth: Payer: Self-pay | Admitting: Family Medicine

## 2023-10-25 NOTE — Telephone Encounter (Signed)
 Dr Birdie Sons is no longer at this location. Please call the office to schedule a Transfer of Care to either Dr Charlann Lange, Darleen Crocker or Kara Dies, NP   Thank you  [id number], please schedule a TOC visit for this patient. [facility]
